# Patient Record
Sex: Female | Born: 2000 | Race: White | Hispanic: No | Marital: Single | Smoking: Never smoker
Health system: Southern US, Community
[De-identification: ages and names within clinical notes are randomized; demographics above are authoritative.]

## PROBLEM LIST (undated history)

## (undated) DIAGNOSIS — F0781 Postconcussional syndrome: Secondary | ICD-10-CM

## (undated) DIAGNOSIS — D649 Anemia, unspecified: Secondary | ICD-10-CM

## (undated) DIAGNOSIS — H5010 Unspecified exotropia: Secondary | ICD-10-CM

## (undated) HISTORY — DX: Anemia, unspecified: D64.9

## (undated) HISTORY — PX: STRABISMUS SURGERY: SHX218

## (undated) HISTORY — PX: WISDOM TOOTH EXTRACTION: SHX21

---
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
## 2020-01-15 ENCOUNTER — Other Ambulatory Visit: Payer: Self-pay

## 2020-01-15 ENCOUNTER — Emergency Department
Admission: EM | Admit: 2020-01-15 | Discharge: 2020-01-15 | Disposition: A | Discharge status: Home / Self Care | Payer: 59 | Attending: Emergency Medicine | Admitting: Emergency Medicine

## 2020-01-15 DIAGNOSIS — T7840XA Allergy, unspecified, initial encounter: Secondary | ICD-10-CM

## 2020-01-15 DIAGNOSIS — T63441A Toxic effect of venom of bees, accidental (unintentional), initial encounter: Secondary | ICD-10-CM | POA: Insufficient documentation

## 2020-01-15 DIAGNOSIS — R070 Pain in throat: Secondary | ICD-10-CM | POA: Diagnosis present

## 2020-01-15 MED ORDER — FAMOTIDINE IN NACL 20-0.9 MG/50ML-% IV SOLN
20.0000 mg | Freq: Once | INTRAVENOUS | Status: AC
  Administered 2020-01-15: 20 mg via INTRAVENOUS
  Filled 2020-01-15: qty 50

## 2020-01-15 MED ORDER — DIPHENHYDRAMINE HCL 50 MG/ML IJ SOLN: 25.0000 mg | Freq: Once | INTRAMUSCULAR | Status: AC

## 2020-01-15 MED ORDER — EPINEPHRINE 0.3 MG/0.3ML IJ SOAJ
INTRAMUSCULAR | Status: AC
  Administered 2020-01-15: 0.3 mg via INTRAMUSCULAR
  Filled 2020-01-15: qty 0.3

## 2020-01-15 MED ORDER — METHYLPREDNISOLONE SODIUM SUCC 125 MG IJ SOLR: 125.0000 mg | Freq: Once | INTRAMUSCULAR | Status: AC

## 2020-01-15 MED ORDER — EPINEPHRINE 0.3 MG/0.3ML IJ SOAJ: 0.3000 mg | INTRAMUSCULAR | 1 refills | Status: DC | PRN

## 2020-01-15 MED ORDER — METHYLPREDNISOLONE SODIUM SUCC 125 MG IJ SOLR
INTRAMUSCULAR | Status: AC
  Administered 2020-01-15: 125 mg via INTRAVENOUS
  Filled 2020-01-15: qty 2

## 2020-01-15 MED ORDER — PREDNISONE 20 MG PO TABS: 40.0000 mg | ORAL_TABLET | Freq: Every day | ORAL | 0 refills | Status: AC

## 2020-01-15 MED ORDER — EPINEPHRINE 0.3 MG/0.3ML IJ SOAJ: 0.3000 mg | Freq: Once | INTRAMUSCULAR | Status: AC

## 2020-01-15 MED ORDER — DIPHENHYDRAMINE HCL 50 MG/ML IJ SOLN
INTRAMUSCULAR | Status: AC
  Administered 2020-01-15: 25 mg via INTRAVENOUS
  Filled 2020-01-15: qty 1

## 2020-01-15 NOTE — Discharge Instructions (Signed)
Please seek medical attention for any high fevers, chest pain, shortness of breath, change in behavior, persistent vomiting, bloody stool or any other new or concerning symptoms.  

## 2020-01-15 NOTE — ED Triage Notes (Signed)
Pt to ed after insect sting approx 30-40 min ago. PT having trouble talking, states in her throat she feels like she needs to cough but can't.  Throat is red, tongue not swollen at this time. No known allergy to bees. Pt took 42mL benadryl

## 2020-01-15 NOTE — ED Provider Notes (Signed)
[facility] Emergency Department Provider Note   ____________________________________________   I have reviewed the triage vital signs and the nursing notes.   HISTORY  Chief Complaint Insect Bite   History limited by: Not Limited   HPI Shari Levine is a 19 y.o. female who presents to the emergency department today because of concerns for possible allergic reaction after a bee sting.  Patient states that she got stung by the bee roughly 30 minutes prior to my evaluation.  It stung her in the right arm.  She did take some Benadryl at home.  She states she is feeling like her throat is tight and her voice has changed.  She denies any nausea or vomiting.  Denies any chest tightness.  Patient denies similar reaction to bee stings in the past.  Records reviewed. Per medical record review patient has a history of allergies to aloe and green tea.   Past Medical History:  Diagnosis Date  . 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 Post concussion syndrome     Patient Active Problem List   Diagnosis Date Noted  . Suicidal ideation     Past Surgical History:  Procedure Laterality Date  . CLOSED REDUCTION NASAL FRACTURE N/A 05/27/2015   Procedure: CLOSED REDUCTION NASAL FRACTURE;  Surgeon: Geanie Logan, MD;  Location: [facility];  Service: ENT;  Laterality: N/A;  . STRABISMUS SURGERY Bilateral    age 56  . STRABISMUS SURGERY Bilateral 08/07/2015   Procedure: REPAIR STRABISMUS PEDIATRIC;  Surgeon: French Ana, MD;  Location: [facility];  Service: Ophthalmology;  Laterality: Bilateral;  . WISDOM TOOTH EXTRACTION      Prior to Admission medications   Medication Sig Start Date End Date Taking? Authorizing Provider  medroxyPROGESTERone (DEPO-PROVERA) 150 MG/ML injection Inject 150 mg into the muscle every 3 (three) months.    [provider]  naproxen (NAPROSYN) 250 MG tablet Take by mouth 2 (two) times daily with a meal.    [provider]    Allergies Aloe and Green tea [cholestatin]  Family History  Problem Relation Age of Onset  . Diabetes Maternal Grandfather   . Hypertension Maternal Grandfather   . Heart disease Maternal Grandfather   . Kidney disease Maternal Grandfather     Social History Social History   Tobacco Use  . Smoking status: Never Smoker  . Smokeless tobacco: Never Used  Vaping Use  . Vaping Use: Never used  Substance Use Topics  . Alcohol use: No  . Drug use: No    Review of Systems Constitutional: No fever/chills Eyes: No visual changes. ENT: Positive for throat tightening.  Cardiovascular: Denies chest pain. Respiratory: Denies shortness of breath. Gastrointestinal: No abdominal pain.  No nausea, no vomiting.  No diarrhea.   Genitourinary: Negative for dysuria. Musculoskeletal: Negative for back pain. Skin: Negative for rash. Neurological: Negative for headaches, focal weakness or numbness.  ____________________________________________   PHYSICAL EXAM:  VITAL SIGNS: ED Triage Vitals  Enc Vitals Group     BP 01/15/20 1948 (!) 154/93     Pulse Rate 01/15/20 1948 98     Resp 01/15/20 1948 20     Temp 01/15/20 1948 99.1 F (37.3 C)     Temp Source 01/15/20 1948 Oral     SpO2 01/15/20 1948 98 %     Weight 01/15/20 1949 160 lb (72.6 kg)     Height 01/15/20 1949 5\' 5"  (1.651 m)   Constitutional: Alert and oriented.  Eyes:  Conjunctivae are normal.  ENT      Head: Normocephalic and atraumatic.      Nose: No congestion/rhinnorhea.      Mouth/Throat: Mucous membranes are moist.      Neck: No stridor. Hematological/Lymphatic/Immunilogical: No cervical lymphadenopathy. Cardiovascular: Normal rate, regular rhythm.  No murmurs, rubs, or gallops.  Respiratory: Normal respiratory effort without tachypnea nor retractions. Breath sounds are clear and equal bilaterally. No wheezes/rales/rhonchi. Gastrointestinal: Soft and non tender. No rebound. No guarding.   Genitourinary: Deferred Musculoskeletal: Normal range of motion in all extremities. No lower extremity edema. Neurologic:  Coarse voice. Moving all extremities. Skin:  Skin is warm, dry and intact. No rash noted. Psychiatric: Mood and affect are normal. Speech and behavior are normal. Patient exhibits appropriate insight and judgment.  ____________________________________________    LABS (pertinent positives/negatives)  None  ____________________________________________   EKG  None  ____________________________________________    RADIOLOGY  None   ____________________________________________   PROCEDURES  Procedures  ____________________________________________   INITIAL IMPRESSION / ASSESSMENT AND PLAN / ED COURSE  Pertinent labs & imaging results that were available during my care of the patient were reviewed by me and considered in my medical decision making (see chart for details).   Patient presented to the emergency department today because of concerns for possible allergic reaction to a bee sting.  Patient states that the bee sting happened about 30 minutes prior to my evaluation.  She did take some Benadryl at home.  On exam patient does have a coarse voice.  She feels like her throat is tight.  Because of this she was given an epinephrine shot.  On recheck shortly thereafter she did feel significant improvement in her throat.  Will plan observing here in the emergency department.  Patient was also given steroids Pepcid.   ____________________________________________   FINAL CLINICAL IMPRESSION(S) / ED DIAGNOSES  Final diagnoses:  Allergic reaction, initial encounter     Note: This dictation was prepared with Dragon dictation. Any transcriptional errors that result from this process are unintentional     Phineas Semen, MD 01/15/20 2027

## 2020-01-16 DIAGNOSIS — T7840XA Allergy, unspecified, initial encounter: Secondary | ICD-10-CM | POA: Diagnosis not present

## 2020-01-16 DIAGNOSIS — R079 Chest pain, unspecified: Secondary | ICD-10-CM | POA: Diagnosis not present

## 2020-01-16 DIAGNOSIS — J029 Acute pharyngitis, unspecified: Secondary | ICD-10-CM | POA: Insufficient documentation

## 2020-01-16 DIAGNOSIS — Z5321 Procedure and treatment not carried out due to patient leaving prior to being seen by health care provider: Secondary | ICD-10-CM | POA: Diagnosis not present

## 2020-01-17 ENCOUNTER — Other Ambulatory Visit: Payer: Self-pay

## 2020-01-17 ENCOUNTER — Encounter: Payer: Self-pay | Admitting: Emergency Medicine

## 2020-01-17 ENCOUNTER — Emergency Department
Admission: EM | Admit: 2020-01-17 | Discharge: 2020-01-17 | Disposition: A | Discharge status: Home / Self Care | Payer: 59 | Attending: Emergency Medicine | Admitting: Emergency Medicine

## 2020-01-17 DIAGNOSIS — J029 Acute pharyngitis, unspecified: Secondary | ICD-10-CM | POA: Diagnosis not present

## 2020-01-17 LAB — BASIC METABOLIC PANEL
Anion gap: 11 (ref 5–15)
BUN: 16 mg/dL (ref 6–20)
CO2: 25 mmol/L (ref 22–32)
Calcium: 8.7 mg/dL — ABNORMAL LOW (ref 8.9–10.3)
Chloride: 106 mmol/L (ref 98–111)
Creatinine, Ser: 0.92 mg/dL (ref 0.44–1.00)
GFR, Estimated: >60 mL/min (ref >60)
Glucose, Bld: 94 mg/dL (ref 70–99)
Potassium: 3.4 mmol/L — ABNORMAL LOW (ref 3.5–5.1)
Sodium: 142 mmol/L (ref 135–145)

## 2020-01-17 LAB — CBC
HCT: 42.6 % (ref 36.0–46.0)
Hemoglobin: 14.7 g/dL (ref 12.0–15.0)
MCH: 30.1 pg (ref 26.0–34.0)
MCHC: 34.5 g/dL (ref 30.0–36.0)
MCV: 87.3 fL (ref 80.0–100.0)
Platelets: 245 10*3/uL (ref 150–400)
RBC: 4.88 MIL/uL (ref 3.87–5.11)
RDW: 12.6 % (ref 11.5–15.5)
WBC: 10.6 10*3/uL — ABNORMAL HIGH (ref 4.0–10.5)
nRBC: 0 % (ref 0.0–0.2)

## 2020-01-17 LAB — MONONUCLEOSIS SCREEN: Mono Screen: NEGATIVE

## 2020-01-17 LAB — GROUP A STREP BY PCR: Group A Strep by PCR: NOT DETECTED

## 2020-01-17 MED ORDER — DIPHENHYDRAMINE HCL 25 MG PO CAPS
50.0000 mg | ORAL_CAPSULE | Freq: Once | ORAL | Status: AC
  Administered 2020-01-17: 50 mg via ORAL
  Filled 2020-01-17: qty 2

## 2020-01-17 MED ORDER — PREDNISONE 20 MG PO TABS
40.0000 mg | ORAL_TABLET | Freq: Once | ORAL | Status: AC
  Administered 2020-01-17: 40 mg via ORAL
  Filled 2020-01-17: qty 2

## 2020-01-17 MED ORDER — FAMOTIDINE 20 MG PO TABS
20.0000 mg | ORAL_TABLET | Freq: Once | ORAL | Status: AC
  Administered 2020-01-17: 20 mg via ORAL
  Filled 2020-01-17: qty 1

## 2020-01-17 NOTE — ED Notes (Signed)
No answer when called several times from lobby 

## 2020-01-17 NOTE — ED Notes (Signed)
Pt sitting in triage on phone calmly, chest rise even and unlabored, skin WNL, in NAD at this time.  Orders placed by  Dr. Manson Passey.

## 2020-01-17 NOTE — ED Triage Notes (Signed)
Pt to ED from home c/o sore throat, feeling like it is swollen.  Pt states stung on Tuesday evening and came to ED and seen and given epipen and other meds, watched for 4 hours and went home and to bed fine.  Woke up the next morning with sore throat that progressively got worse today.  Pt also states developed chest pain upon arrival.  Talking in complete sentences but is hoarse and states some difficulty swallowing saliva.  Took benadryl at home today without relief.

## 2020-07-10 ENCOUNTER — Other Ambulatory Visit: Payer: Self-pay

## 2020-07-10 DIAGNOSIS — Z20822 Contact with and (suspected) exposure to covid-19: Secondary | ICD-10-CM | POA: Diagnosis not present

## 2020-07-10 DIAGNOSIS — A084 Viral intestinal infection, unspecified: Secondary | ICD-10-CM | POA: Diagnosis not present

## 2020-07-10 DIAGNOSIS — R109 Unspecified abdominal pain: Secondary | ICD-10-CM | POA: Diagnosis present

## 2020-07-10 LAB — CBC
HCT: 42.1 % (ref 36.0–46.0)
Hemoglobin: 14.3 g/dL (ref 12.0–15.0)
MCH: 29.3 pg (ref 26.0–34.0)
MCHC: 34 g/dL (ref 30.0–36.0)
MCV: 86.3 fL (ref 80.0–100.0)
Platelets: 218 10*3/uL (ref 150–400)
RBC: 4.88 MIL/uL (ref 3.87–5.11)
RDW: 12.4 % (ref 11.5–15.5)
WBC: 5.8 10*3/uL (ref 4.0–10.5)
nRBC: 0 % (ref 0.0–0.2)

## 2020-07-10 LAB — URINALYSIS, COMPLETE (UACMP) WITH MICROSCOPIC
Bilirubin Urine: NEGATIVE
Glucose, UA: NEGATIVE mg/dL
Hgb urine dipstick: NEGATIVE
Ketones, ur: NEGATIVE mg/dL
Leukocytes,Ua: NEGATIVE
Nitrite: NEGATIVE
Protein, ur: NEGATIVE mg/dL
Specific Gravity, Urine: 1.028 (ref 1.005–1.030)
pH: 5 (ref 5.0–8.0)

## 2020-07-10 LAB — COMPREHENSIVE METABOLIC PANEL
ALT: 36 U/L (ref 0–44)
AST: 31 U/L (ref 15–41)
Albumin: 3.8 g/dL (ref 3.5–5.0)
Alkaline Phosphatase: 76 U/L (ref 38–126)
Anion gap: 9 (ref 5–15)
BUN: 12 mg/dL (ref 6–20)
CO2: 23 mmol/L (ref 22–32)
Calcium: 8.2 mg/dL — ABNORMAL LOW (ref 8.9–10.3)
Chloride: 106 mmol/L (ref 98–111)
Creatinine, Ser: 0.75 mg/dL (ref 0.44–1.00)
GFR, Estimated: >60 mL/min (ref >60)
Glucose, Bld: 113 mg/dL — ABNORMAL HIGH (ref 70–99)
Potassium: 3.3 mmol/L — ABNORMAL LOW (ref 3.5–5.1)
Sodium: 138 mmol/L (ref 135–145)
Total Bilirubin: 0.5 mg/dL (ref 0.3–1.2)
Total Protein: 6.8 g/dL (ref 6.5–8.1)

## 2020-07-10 LAB — LIPASE, BLOOD: Lipase: 31 U/L (ref 11–51)

## 2020-07-10 LAB — POC URINE PREG, ED: Preg Test, Ur: NEGATIVE

## 2020-07-10 NOTE — ED Triage Notes (Signed)
Bilateral lower abd pain since 0300 this AM with n/v. Denies diarrhea. Reports temp of 101.4 and taking tylenol 30 min pta. Reports midsternal chest pain that patient associates with emesis.

## 2020-07-11 ENCOUNTER — Emergency Department
Admission: EM | Admit: 2020-07-11 | Discharge: 2020-07-11 | Disposition: A | Discharge status: Home / Self Care | Payer: Medicaid Other | Attending: Emergency Medicine | Admitting: Emergency Medicine

## 2020-07-11 DIAGNOSIS — A084 Viral intestinal infection, unspecified: Secondary | ICD-10-CM

## 2020-07-11 LAB — RESP PANEL BY RT-PCR (FLU A&B, COVID) ARPGX2
Influenza A by PCR: NEGATIVE
Influenza B by PCR: NEGATIVE
SARS Coronavirus 2 by RT PCR: NEGATIVE

## 2020-07-11 MED ORDER — ONDANSETRON 4 MG PO TBDP: 4.0000 mg | ORAL_TABLET | Freq: Three times a day (TID) | ORAL | 0 refills | Status: DC | PRN

## 2020-07-11 MED ORDER — LACTATED RINGERS IV BOLUS
1000.0000 mL | Freq: Once | INTRAVENOUS | Status: AC
  Administered 2020-07-11: 1000 mL via INTRAVENOUS

## 2020-07-11 MED ORDER — FAMOTIDINE IN NACL 20-0.9 MG/50ML-% IV SOLN
20.0000 mg | Freq: Once | INTRAVENOUS | Status: AC
  Administered 2020-07-11: 20 mg via INTRAVENOUS
  Filled 2020-07-11: qty 50

## 2020-07-11 MED ORDER — KETOROLAC TROMETHAMINE 30 MG/ML IJ SOLN
15.0000 mg | Freq: Once | INTRAMUSCULAR | Status: AC
  Administered 2020-07-11: 15 mg via INTRAVENOUS
  Filled 2020-07-11: qty 1

## 2020-07-11 MED ORDER — ONDANSETRON HCL 4 MG/2ML IJ SOLN
4.0000 mg | Freq: Once | INTRAMUSCULAR | Status: AC
  Administered 2020-07-11: 4 mg via INTRAVENOUS
  Filled 2020-07-11: qty 2

## 2020-07-11 NOTE — ED Provider Notes (Signed)
[facility] Emergency Department Provider Note  ____________________________________________  Time seen: Approximately 12:52 AM  I have reviewed the triage vital signs and the nursing notes.   HISTORY  Chief Complaint Abdominal Pain   HPI Shari Levine is a 20 y.o. female who presents for evaluation of nausea and vomiting.  Patient woke up at 3 AM yesterday morning vomiting.  Has had several episodes of nonbloody nonbilious emesis.  According to her T-max at home of 100.53F.  Has been unable to keep anything down all day today.  No diarrhea.  Has had diffuse crampy abdominal pain.  After vomiting several times patient started having burning sensation in her chest.  At that point she noticed that her blood pressure was elevated which prompted the visit to the emergency room.  No shortness of breath, no cough or congestion.  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   Past Medical History:  Diagnosis Date  . 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 Post concussion syndrome     Patient Active Problem List   Diagnosis Date Noted  . Suicidal ideation     Past Surgical History:  Procedure Laterality Date  . CLOSED REDUCTION NASAL FRACTURE N/A 05/27/2015   Procedure: CLOSED REDUCTION NASAL FRACTURE;  Surgeon: Geanie Logan, MD;  Location: [facility];  Service: ENT;  Laterality: N/A;  . STRABISMUS SURGERY Bilateral    age 52  . STRABISMUS SURGERY Bilateral 08/07/2015   Procedure: REPAIR STRABISMUS PEDIATRIC;  Surgeon: French Ana, MD;  Location: [facility];  Service: Ophthalmology;  Laterality: Bilateral;  . WISDOM TOOTH EXTRACTION      Prior to Admission medications   Medication Sig Start Date End Date Taking? Authorizing Provider  ondansetron (ZOFRAN ODT) 4 MG disintegrating tablet Take 1 tablet (4 mg total) by mouth every 8 (eight) hours as needed. 07/11/20  Yes Hannahgrace Lalli, [place], MD  EPINEPHrine (EPIPEN  2-PAK) 0.3 mg/0.3 mL IJ SOAJ injection Inject 0.3 mg into the muscle as needed for anaphylaxis. 01/15/20   Phineas Semen, MD  medroxyPROGESTERone (DEPO-PROVERA) 150 MG/ML injection Inject 150 mg into the muscle every 3 (three) months.    [provider]  naproxen (NAPROSYN) 250 MG tablet Take by mouth 2 (two) times daily with a meal.    [provider]    Allergies Aloe and Green tea [cholestatin]  Family History  Problem Relation Age of Onset  . Diabetes Maternal Grandfather   . Hypertension Maternal Grandfather   . Heart disease Maternal Grandfather   . Kidney disease Maternal Grandfather     Social History Social History   Tobacco Use  . Smoking status: Never Smoker  . Smokeless tobacco: Never Used  Vaping Use  . Vaping Use: Never used  Substance Use Topics  . Alcohol use: No  . Drug use: No    Review of Systems  Constitutional: + fever. Eyes: Negative for visual changes. ENT: Negative for sore throat. Neck: No neck pain  Cardiovascular: Negative for chest pain. + burning Respiratory: Negative for shortness of breath. Gastrointestinal: + cramping abdominal pain, nausea, and vomiting. No diarrhea. Genitourinary: Negative for dysuria. Musculoskeletal: Negative for back pain. Skin: Negative for rash. Neurological: Negative for headaches, weakness or numbness. Psych: No SI or HI  ____________________________________________   PHYSICAL EXAM:  VITAL SIGNS: ED Triage Vitals  Enc Vitals Group     BP 07/10/20 2313 119/83     Pulse Rate 07/10/20 2313 91  Resp 07/10/20 2313 18     Temp 07/10/20 2313 98.9 F (37.2 C)     Temp Source 07/10/20 2313 Oral     SpO2 07/10/20 2313 98 %     Weight 07/10/20 2308 160 lb (72.6 kg)     Height 07/10/20 2308 5\' 5"  (1.651 m)     Head Circumference --      Peak Flow --      Pain Score 07/10/20 2308 8     Pain Loc --      Pain Edu? --      Excl. in GC? --     Constitutional: Alert and oriented. Well  appearing and in no apparent distress. HEENT:      Head: Normocephalic and atraumatic.         Eyes: Conjunctivae are normal. Sclera is non-icteric.       Mouth/Throat: Mucous membranes are moist.       Neck: Supple with no signs of meningismus. Cardiovascular: Regular rate and rhythm. No murmurs, gallops, or rubs. 2+ symmetrical distal pulses are present in all extremities. No JVD. Respiratory: Normal respiratory effort. Lungs are clear to auscultation bilaterally.  Gastrointestinal: Soft, non tender, and non distended with positive bowel sounds. No rebound or guarding. Genitourinary: No CVA tenderness. Musculoskeletal:  No edema, cyanosis, or erythema of extremities. Neurologic: Normal speech and language. Face is symmetric. Moving all extremities. No gross focal neurologic deficits are appreciated. Skin: Skin is warm, dry and intact. No rash noted. Psychiatric: Mood and affect are normal. Speech and behavior are normal.  ____________________________________________   LABS (all labs ordered are listed, but only abnormal results are displayed)  Labs Reviewed  COMPREHENSIVE METABOLIC PANEL - Abnormal; Notable for the following components:      Result Value   Potassium 3.3 (*)    Glucose, Bld 113 (*)    Calcium 8.2 (*)    All other components within normal limits  URINALYSIS, COMPLETE (UACMP) WITH MICROSCOPIC - Abnormal; Notable for the following components:   Color, Urine YELLOW (*)    APPearance CLEAR (*)    Bacteria, UA RARE (*)    All other components within normal limits  RESP PANEL BY RT-PCR (FLU A&B, COVID) ARPGX2  LIPASE, BLOOD  CBC  POC URINE PREG, ED   ____________________________________________  EKG  ED ECG REPORT I, 09/09/20, the attending physician, personally viewed and interpreted this ECG.  Normal sinus rhythm, rate of 89, normal intervals, normal axis, no ST elevations or  depressions. ____________________________________________  RADIOLOGY  none  ____________________________________________   PROCEDURES  Procedure(s) performed: None Procedures Critical Care performed:  None ____________________________________________   INITIAL IMPRESSION / ASSESSMENT AND PLAN / ED COURSE  20 y.o. female who presents for evaluation of nausea and vomiting, and crampy abdominal pain since 3 AM yesterday morning.  Patient is extremely well-appearing in no distress with normal vital signs, abdomen is soft with no significant tenderness, lungs are clear to auscultation.  Differential diagnosis including viral gastritis versus gastroenteritis versus flu versus Covid.  Labs with no signs of significant dehydration or significant electrolyte derangements, no leukocytosis, no anemia, no ketonuria, no UTI, no pregnancy.  Normal LFTs and lipase.  Patient will be given IV fluids, IV Pepcid, IV Zofran, and IV Toradol.  Will swab for Covid and flu since she is a 26.  History gathered from patient and her mom is at bedside.  Plan discussed with both of them.  Old medical records reviewed  _________________________ 1:45 AM  on 07/11/2020 -----------------------------------------  Covid and flu negative.  Patient feels markedly improved.  Will discharge home on a bland diet, Zofran, increase oral hydration, and close follow-up with PCP.  Discussed my standard return precautions    _____________________________________________ Please note:  Patient was evaluated in Emergency Department today for the symptoms described in the history of present illness. Patient was evaluated in the context of the global COVID-19 pandemic, which necessitated consideration that the patient might be at risk for infection with the SARS-CoV-2 virus that causes COVID-19. Institutional protocols and algorithms that pertain to the evaluation of patients at risk for COVID-19 are in a state of  rapid change based on information released by regulatory bodies including the CDC and federal and state organizations. These policies and algorithms were followed during the patient's care in the ED.  Some ED evaluations and interventions may be delayed as a result of limited staffing during the pandemic.   [place] Controlled Substance Database was reviewed by me. ____________________________________________   FINAL CLINICAL IMPRESSION(S) / ED DIAGNOSES   Final diagnoses:  Viral gastroenteritis      NEW MEDICATIONS STARTED DURING THIS VISIT:  ED Discharge Orders         Ordered    ondansetron (ZOFRAN ODT) 4 MG disintegrating tablet  Every 8 hours PRN        07/11/20 0144           Note:  This document was prepared using Dragon voice recognition software and may include unintentional dictation errors.    Don Perking, [place], MD 07/11/20 [phone]

## 2020-10-13 ENCOUNTER — Encounter: Payer: Self-pay | Admitting: Emergency Medicine

## 2020-10-13 ENCOUNTER — Ambulatory Visit
Admission: EM | Admit: 2020-10-13 | Discharge: 2020-10-13 | Disposition: A | Discharge status: Home / Self Care | Payer: Medicaid Other | Attending: Emergency Medicine | Admitting: Emergency Medicine

## 2020-10-13 ENCOUNTER — Other Ambulatory Visit: Payer: Self-pay

## 2020-10-13 DIAGNOSIS — B349 Viral infection, unspecified: Secondary | ICD-10-CM

## 2020-10-13 LAB — POCT RAPID STREP A (OFFICE): Rapid Strep A Screen: NEGATIVE

## 2020-10-13 MED ORDER — LIDOCAINE VISCOUS HCL 2 % MT SOLN: 15.0000 mL | OROMUCOSAL | 0 refills | Status: AC | PRN

## 2020-10-13 NOTE — ED Triage Notes (Signed)
Patient c/o sore throat x 3 days.   Patient denies fever at home.   Patient endorses nasal congestion.   Patient endorses RT sided "neck lymph node and ear swelling".   Patient has taken Tylenol with no relief of symptoms.

## 2020-10-13 NOTE — Discharge Instructions (Addendum)
We will call you with any positive results from your COVID-19 testing completed in clinic today.  If you do not receive a phone call from us within the next 3-5 days, check your MyChart for up-to-date health information related to testing completed in clinic today.   For most people this is a self-limiting process and can take anywhere from 7 - 10 days to start feeling better. A cough can last up to 3 weeks. Pay special attention to handwashing as this can help prevent the spread of the virus.   Always read the labels of cough and cold medications as they may contain some of the ingredients below.  Rest, push lots of fluids (especially water), and utilize supportive care for symptoms. You may take acetaminophen (Tylenol) every 4-6 hours and ibuprofen every 6-8 hours for muscle pain, joint pain, headaches (you may also alternate these medications). Mucinex (guaifenesin) may be taken over the counter for cough as needed can loosen phlegm. Please read the instructions and take as directed.  Sudafed (pseudophedrine) is sold behind the counter and can help reduce nasal pressure; avoid taking this if you have high blood pressure or feel jittery. Sudafed PE (phenylephrine) can be a helpful, short-term, over-the-counter alternative to limit side effects or if you have high blood pressure.  Flonase nasal spray can help alleviate congestion and sinus pressure. Many patients choose Afrin as a nasal decongestant; do not use for more than 3 days for risk of rebound (increased symptoms after stopping medication).  Saline nasal sprays or rinses can also help nasal congestion (use bottled or sterile water). Warm tea with lemon and honey can sooth sore throat and cough, as can cough drops.   Return to clinic for high fever not improving with medications, chest pain, difficulty breathing, non-stop vomiting, or coughing blood. Follow-up with your primary care provider if symptoms do not improve as expected in the next  5-7 days.  

## 2020-10-13 NOTE — ED Provider Notes (Addendum)
CHIEF COMPLAINT:   Chief Complaint  Patient presents with   Sore Throat     SUBJECTIVE/HPI:   Sore Throat  A very pleasant 20 y.o.Female presents today with sore throat for the last 3 days. She also reports R ear pain. Has tried tylenol without relief. Patient does not report any shortness of breath, chest pain, palpitations, visual changes, weakness, tingling, headache, nausea, vomiting, diarrhea, fever, chills   has a past medical history of Exotropia of both eyes (07/2015) and Post concussion syndrome.  ROS:  Review of Systems See Subjective/HPI Medications, Allergies and Problem List personally reviewed in Epic today OBJECTIVE:   Vitals:   10/13/20 0916  BP: 124/81  Pulse: 77  Resp: 18  Temp: 98.5 F (36.9 C)  SpO2: 96%    Physical Exam   General: Appears well-developed and well-nourished. No acute distress.  HEENT Head: Normocephalic and atraumatic.   Ears: Hearing grossly intact, no drainage or visible deformity.  Nose: No nasal deviation.   Mouth/Throat: No stridor or tracheal deviation.  Mildly erythematous posterior pharynx noted with clear drainage present.  No white patchy exudate noted. Eyes: Conjunctivae and EOM are normal. No eye drainage or scleral icterus bilaterally.  Neck: Normal range of motion, neck is supple.  Cardiovascular: Normal rate. Regular rhythm; no murmurs, gallops, or rubs.  Pulm/Chest: No respiratory distress. Breath sounds normal bilaterally without wheezes, rhonchi, or rales.  Neurological: Alert and oriented to person, place, and time.  Skin: Skin is warm and dry.  No rashes, lesions, abrasions or bruising noted to skin.   Psychiatric: Normal mood, affect, behavior, and thought content.   Vital signs and nursing note reviewed.   Patient stable and cooperative with examination. PROCEDURES:    LABS/X-RAYS/EKG/MEDS:    Results for orders placed or performed during the hospital encounter of 10/13/20  POCT rapid strep A  Result  Value Ref Range   Rapid Strep A Screen Negative Negative   Strep negative COVID pending MEDICAL DECISION MAKING:   Patient presents with sore throat for the last 3 days. She also reports R ear pain. Has tried tylenol without relief. Patient does not report any shortness of breath, chest pain, palpitations, visual changes, weakness, tingling, headache, nausea, vomiting, diarrhea, fever, chills. Chart review completed. Strep negative. COVID PCR pending. Likely, viral illness. Low suspicion for underlying bacterial etiology which would require antibiotics. Rx'd viscous lidocaine to the patient's pharmacy. Advised of at home treatment and care as outlined in AVS. Provided work note. Return as needed.  Patient verbalized understanding and agreed with treatment plan.  Patient stable upon discharge. ASSESSMENT/PLAN:  1. Viral illness - Novel Coronavirus, NAA ([facility]); Standing - Novel Coronavirus, NAA ([facility])  Meds ordered this encounter  Medications   lidocaine (XYLOCAINE) 2 % solution    Sig: Use as directed 15 mLs in the mouth or throat as needed for up to 7 days for mouth pain.    Dispense:  100 mL    Refill:  0    Order Specific Question:   Supervising Provider    Answer:   Merrilee Jansky [id number]    Instructions about new medications and side effects provided.  Plan:   Discharge Instructions      We will call you with any positive results from your COVID-19 testing completed in clinic today.  If you do not receive a phone call from [place] within the next 3-5 days, check your MyChart for up-to-date health information related to testing completed in clinic today.  For most people this is a self-limiting process and can take anywhere from 7 - 10 days to start feeling better. A cough can last up to 3 weeks. Pay special attention to handwashing as this can help prevent the spread of the virus.   Always read the labels of cough and cold medications as they may contain some of the  ingredients below.  Rest, push lots of fluids (especially water), and utilize supportive care for symptoms. You may take acetaminophen (Tylenol) every 4-6 hours and ibuprofen every 6-8 hours for muscle pain, joint pain, headaches (you may also alternate these medications). Mucinex (guaifenesin) may be taken over the counter for cough as needed can loosen phlegm. Please read the instructions and take as directed.  Sudafed (pseudophedrine) is sold behind the counter and can help reduce nasal pressure; avoid taking this if you have high blood pressure or feel jittery. Sudafed PE (phenylephrine) can be a helpful, short-term, over-the-counter alternative to limit side effects or if you have high blood pressure.  Flonase nasal spray can help alleviate congestion and sinus pressure. Many patients choose Afrin as a nasal decongestant; do not use for more than 3 days for risk of rebound (increased symptoms after stopping medication).  Saline nasal sprays or rinses can also help nasal congestion (use bottled or sterile water). Warm tea with lemon and honey can sooth sore throat and cough, as can cough drops.   Return to clinic for high fever not improving with medications, chest pain, difficulty breathing, non-stop vomiting, or coughing blood. Follow-up with your primary care provider if symptoms do not improve as expected in the next 5-7 days.        A copy of these instructions have been given to the patient or responsible adult who demonstrated the ability to learn, asked appropriate questions, and verbalized understanding of the plan of care.  There were no barriers to learning identified.    Amalia Greenhouse, FNP-C 10/13/20   This note was partially made with the aid of speech-to-text dictation; typographical errors are not intentional.    Amalia Greenhouse, FNP 10/13/20 1019    Amalia Greenhouse, FNP 10/13/20 1026  Clinical Course as of 10/13/20 1028  Mon Oct 13, 2020  0930 POCT rapid strep A  [KB]    Clinical Course User Index [KB] Amalia Greenhouse, FNP       Amalia Greenhouse, FNP 10/13/20 1028

## 2020-10-14 LAB — NOVEL CORONAVIRUS, NAA: SARS-CoV-2, NAA: NOT DETECTED

## 2020-10-14 LAB — SARS-COV-2, NAA 2 DAY TAT

## 2021-09-09 ENCOUNTER — Emergency Department (HOSPITAL_COMMUNITY)
Admission: EM | Admit: 2021-09-09 | Discharge: 2021-09-09 | Disposition: A | Discharge status: Home / Self Care | Payer: No Typology Code available for payment source | Attending: Emergency Medicine | Admitting: Emergency Medicine

## 2021-09-09 ENCOUNTER — Emergency Department (HOSPITAL_COMMUNITY): Payer: No Typology Code available for payment source

## 2021-09-09 ENCOUNTER — Encounter (HOSPITAL_COMMUNITY): Payer: Self-pay | Admitting: Emergency Medicine

## 2021-09-09 DIAGNOSIS — S6992XA Unspecified injury of left wrist, hand and finger(s), initial encounter: Secondary | ICD-10-CM | POA: Diagnosis present

## 2021-09-09 DIAGNOSIS — X500XXA Overexertion from strenuous movement or load, initial encounter: Secondary | ICD-10-CM | POA: Insufficient documentation

## 2021-09-09 DIAGNOSIS — Y990 Civilian activity done for income or pay: Secondary | ICD-10-CM | POA: Insufficient documentation

## 2021-09-09 NOTE — Discharge Instructions (Signed)
As we discussed your left middle finger is broken.  I placed you in a splint to ensure that it is immobilized.  Recommend that you wear the splint at all times.  I have also given you a referral to our hand specialist to call and schedule an appointment for follow-up of your injuries.  You will need to do this in 1 week.  In the interim, please ensure that you rest, ice, compress, and elevate your injuries as well as take Tylenol/ibuprofen as needed for pain.  Return if development of any new or worsening symptoms.

## 2021-09-09 NOTE — ED Provider Notes (Signed)
[facility] DEPT Provider Note   CSN: [id number] Arrival date & time: 09/09/21  1436     History  Chief Complaint  Patient presents with   Finger Injury    Shari Levine is a 21 y.o. female.  Patient with no pertinent past medical history presents today with left middle finger injury. She states that earlier this morning when she was working at [employer] she was lifting a heavy box of soda above her head when the box slipped and fell out of her hand and landed on her proximal left middle finger. She endorses persistent pain since then. Took tylenol without relief. Denies any other injuries or complaints  The history is provided by the patient. No language interpreter was used.      Home Medications Prior to Admission medications   Medication Sig Start Date End Date Taking? Authorizing Provider  EPINEPHrine (EPIPEN 2-PAK) 0.3 mg/0.3 mL IJ SOAJ injection Inject 0.3 mg into the muscle as needed for anaphylaxis. 01/15/20   Nance Pear, MD  medroxyPROGESTERone (DEPO-PROVERA) 150 MG/ML injection Inject 150 mg into the muscle every 3 (three) months.    [provider]  naproxen (NAPROSYN) 250 MG tablet Take by mouth 2 (two) times daily with a meal.    [provider]  ondansetron (ZOFRAN ODT) 4 MG disintegrating tablet Take 1 tablet (4 mg total) by mouth every 8 (eight) hours as needed. 07/11/20   Rudene Re, MD      Allergies    Aloe and Green tea [cholestatin]    Review of Systems   Review of Systems  Musculoskeletal:  Positive for arthralgias.  All other systems reviewed and are negative.  Physical Exam Updated Vital Signs BP 139/88 (BP Location: Left Arm)   Pulse 84   Temp 98 F (36.7 C) (Oral)   Resp 18   SpO2 97%  Physical Exam Vitals and nursing note reviewed.  Constitutional:      General: She is not in acute distress.    Appearance: Normal appearance. She is normal weight. She is not ill-appearing,  toxic-appearing or diaphoretic.  HENT:     Head: Normocephalic and atraumatic.  Cardiovascular:     Rate and Rhythm: Normal rate.  Pulmonary:     Effort: Pulmonary effort is normal. No respiratory distress.  Musculoskeletal:        General: Normal range of motion.     Cervical back: Normal range of motion.     Comments: Tenderness noted to the MCP joint of the left middle finger with some bruising without erythema or warmth. Some swelling noted in this area as well. ROM intact with some pain. Distal sensation intact, capillary refill less than 2 seconds.  Skin:    General: Skin is warm and dry.  Neurological:     General: No focal deficit present.     Mental Status: She is alert.  Psychiatric:        Mood and Affect: Mood normal.        Behavior: Behavior normal.    ED Results / Procedures / Treatments   Labs (all labs ordered are listed, but only abnormal results are displayed) Labs Reviewed - No data to display  EKG None  Radiology DG Finger Middle Left  Result Date: 09/09/2021 CLINICAL DATA:  Pain in proximal phalange, after box fell on finger EXAM: LEFT MIDDLE FINGER 2+V COMPARISON:  None Available. FINDINGS: Acute minimally displaced intra-articular fracture of the base of the first digit proximal phalanx. No  dislocation. There is no evidence of arthropathy or other focal bone abnormality. Associated subcutaneus soft tissue edema. IMPRESSION: Acute minimally displaced intra-articular fracture of the base of the first digit proximal phalanx. No dislocation. Electronically Signed   By: Iven Finn M.D.   On: 09/09/2021 16:21    Procedures Procedures    Medications Ordered in ED Medications - No data to display  ED Course/ Medical Decision Making/ A&P                           Medical Decision Making  Patient presents today with left middle finger injury. She is afebrile, nontoxic-appearing, and in no acute distress with reassuring vital signs.  Patient left middle  finger X-Ray reveals   Acute minimally displaced intra-articular fracture of the base of the first digit proximal phalanx. No dislocation.     I have personally reviewed and interpreted this imaging and agree with radiology interpretation.   Pain medication offered in the ED which patient refused.  Patient placed in finger splint for management of fracture and given hand surgery follow-up for further evaluation.  Will recommend RICE and Tylenol/ibuprofen as needed for at home. Patient will be dc home & is agreeable with above plan.  Educated on red flag symptoms of prompt immediate return.  Discharged in stable condition.  Findings and plan of care discussed with supervising physician Dr. Armandina Gemma who is in agreement.   Final Clinical Impression(s) / ED Diagnoses Final diagnoses:  Injury of finger of left hand, initial encounter    Rx / DC Orders ED Discharge Orders     None     An After Visit Summary was printed and given to the patient.     Nestor Lewandowsky 09/09/21 1728    Regan Lemming, MD 09/09/21 [phone]

## 2021-09-09 NOTE — ED Provider Triage Note (Signed)
Emergency Medicine Provider Triage Evaluation Note  Burnett Kanaris , a 21 y.o. female  was evaluated in triage.  Pt complains of left middle finger pain secondary to an injury at work.  Patient states she was lifting a heavy box when the box slipped, falling and landing on her proximal middle left finger.  Patient is able to move the digit but has swelling, contusion, and pain.  Review of Systems  Positive: Left middle finger pain Negative: Loss of consciousness  Physical Exam  BP 139/88 (BP Location: Left Arm)   Pulse 84   Temp 98 F (36.7 C) (Oral)   Resp 18   SpO2 97%  Gen:   Awake, no distress   Resp:  Normal effort  MSK:   Moves extremities without difficulty  Other:    Medical Decision Making  Medically screening exam initiated at 3:46 PM.  Appropriate orders placed.  Kenyatta D Hires was informed that the remainder of the evaluation will be completed by another provider, this initial triage assessment does not replace that evaluation, and the importance of remaining in the ED until their evaluation is complete.     Darrick Grinder, PA-C 09/09/21 1547

## 2021-09-09 NOTE — ED Triage Notes (Signed)
Pt reports injury to left middle finger at work today. Pt believes finger is dislocated. Pain radiates into arm.

## 2022-06-09 ENCOUNTER — Encounter: Payer: Self-pay | Admitting: Oncology

## 2022-06-09 ENCOUNTER — Inpatient Hospital Stay: Payer: Medicaid Other | Attending: Oncology | Admitting: Oncology

## 2022-06-09 ENCOUNTER — Inpatient Hospital Stay: Payer: Medicaid Other

## 2022-06-09 VITALS — BP 112/63 | HR 69 | Temp 97.0°F | Resp 18 | Wt 166.7 lb

## 2022-06-09 DIAGNOSIS — Z793 Long term (current) use of hormonal contraceptives: Secondary | ICD-10-CM | POA: Diagnosis not present

## 2022-06-09 DIAGNOSIS — D509 Iron deficiency anemia, unspecified: Secondary | ICD-10-CM | POA: Diagnosis not present

## 2022-06-09 DIAGNOSIS — Z79899 Other long term (current) drug therapy: Secondary | ICD-10-CM | POA: Diagnosis not present

## 2022-06-09 DIAGNOSIS — D500 Iron deficiency anemia secondary to blood loss (chronic): Secondary | ICD-10-CM

## 2022-06-09 DIAGNOSIS — N920 Excessive and frequent menstruation with regular cycle: Secondary | ICD-10-CM | POA: Insufficient documentation

## 2022-06-09 LAB — TECHNOLOGIST SMEAR REVIEW: Plt Morphology: NORMAL

## 2022-06-09 LAB — CBC WITH DIFFERENTIAL/PLATELET
Abs Immature Granulocytes: 0.01 10*3/uL (ref 0.00–0.07)
Basophils Absolute: 0 10*3/uL (ref 0.0–0.1)
Basophils Relative: 1 %
Eosinophils Absolute: 0.1 10*3/uL (ref 0.0–0.5)
Eosinophils Relative: 2 %
HCT: 35.8 % — ABNORMAL LOW (ref 36.0–46.0)
Hemoglobin: 11 g/dL — ABNORMAL LOW (ref 12.0–15.0)
Immature Granulocytes: 0 %
Lymphocytes Relative: 24 %
Lymphs Abs: 1.4 10*3/uL (ref 0.7–4.0)
MCH: 24.6 pg — ABNORMAL LOW (ref 26.0–34.0)
MCHC: 30.7 g/dL (ref 30.0–36.0)
MCV: 79.9 fL — ABNORMAL LOW (ref 80.0–100.0)
Monocytes Absolute: 0.5 10*3/uL (ref 0.1–1.0)
Monocytes Relative: 8 %
Neutro Abs: 3.8 10*3/uL (ref 1.7–7.7)
Neutrophils Relative %: 65 %
Platelets: 213 10*3/uL (ref 150–400)
RBC: 4.48 MIL/uL (ref 3.87–5.11)
RDW: 16 % — ABNORMAL HIGH (ref 11.5–15.5)
WBC: 5.9 10*3/uL (ref 4.0–10.5)
nRBC: 0 % (ref 0.0–0.2)

## 2022-06-09 LAB — IRON AND TIBC
Iron: 39 ug/dL (ref 28–170)
Saturation Ratios: 8 % — ABNORMAL LOW (ref 10.4–31.8)
TIBC: 479 ug/dL — ABNORMAL HIGH (ref 250–450)
UIBC: 440 ug/dL

## 2022-06-09 LAB — COMPREHENSIVE METABOLIC PANEL
ALT: 15 U/L (ref 0–44)
AST: 15 U/L (ref 15–41)
Albumin: 4.1 g/dL (ref 3.5–5.0)
Alkaline Phosphatase: 54 U/L (ref 38–126)
Anion gap: 8 (ref 5–15)
BUN: 15 mg/dL (ref 6–20)
CO2: 24 mmol/L (ref 22–32)
Calcium: 8.4 mg/dL — ABNORMAL LOW (ref 8.9–10.3)
Chloride: 105 mmol/L (ref 98–111)
Creatinine, Ser: 0.82 mg/dL (ref 0.44–1.00)
GFR, Estimated: >60 mL/min (ref >60)
Glucose, Bld: 91 mg/dL (ref 70–99)
Potassium: 4.2 mmol/L (ref 3.5–5.1)
Sodium: 137 mmol/L (ref 135–145)
Total Bilirubin: 0.4 mg/dL (ref 0.3–1.2)
Total Protein: 7.2 g/dL (ref 6.5–8.1)

## 2022-06-09 LAB — RETIC PANEL
Immature Retic Fract: 12.3 % (ref 2.3–15.9)
RBC.: 4.42 MIL/uL (ref 3.87–5.11)
Retic Count, Absolute: 44.6 10*3/uL (ref 19.0–186.0)
Retic Ct Pct: 1 % (ref 0.4–3.1)
Reticulocyte Hemoglobin: 26.6 pg — ABNORMAL LOW (ref >27.9)

## 2022-06-09 LAB — FERRITIN: Ferritin: 6 ng/mL — ABNORMAL LOW (ref 11–307)

## 2022-06-09 NOTE — Progress Notes (Signed)
Hematology/Oncology Consult note [phone] [phone]        REFERRING PROVIDER: Herbert Deaner, FNP   CHIEF COMPLAINTS/REASON FOR VISIT:  Evaluation of anemia   ASSESSMENT & PLAN:   IDA (iron deficiency anemia) Microcytic anemia, suspect iron deficiency. Check CBC, iron TIBC ferritin Discussed about options of iron supplementation if iron deficiency anemia is confirmed. I discussed about option of continue oral iron supplementation and repeat blood work for evaluation of treatment response.  If no significant improvement, then proceed with IV Venofer treatments. Alternative option of proceed with IV Venofer treatments. I discussed about the potential risks including but not limited to allergic reactions/infusion reactions including anaphylactic reactions, phlebitis, high blood pressure, wheezing, SOB, skin rash, weight gain,dark urine, leg swelling, back pain, headache, nausea and fatigue, etc.  Plan IV venofer weekly x3  Labs are reviewed, hemoglobin 11, ferritin 6, consistent with IDA. Will reach out to patient to see if she would like to proceed with oral iron supplementation vs IV venofer.    Orders Placed This Encounter  Procedures   Ferritin    Standing Status:   Future    Number of Occurrences:   1    Standing Expiration Date:   12/10/2022   Iron and TIBC    Standing Status:   Future    Number of Occurrences:   1    Standing Expiration Date:   06/09/2023   CBC with Differential/Platelet    Standing Status:   Future    Number of Occurrences:   1    Standing Expiration Date:   06/09/2023   Retic Panel    Standing Status:   Future    Number of Occurrences:   1    Standing Expiration Date:   06/09/2023   Comprehensive metabolic panel    Standing Status:   Future    Number of Occurrences:   1    Standing Expiration Date:   06/09/2023   Technologist smear review    Standing Status:   Future    Number of Occurrences:   1    Standing Expiration  Date:   06/09/2023    Order Specific Question:   Clinical information:    Answer:   anemia   Follow up in 3 months.  All questions were answered. The patient knows to call the clinic with any problems, questions or concerns.  Earlie Server, MD, PhD [facility] Hematology Oncology 06/09/2022   HISTORY OF PRESENTING ILLNESS:   Shari Levine is a  22 y.o.  female with PMH listed below was seen in consultation at the request of  Herbert Deaner, FNP  for evaluation of anemia Patient recently had blood work done at primary care provider's office.  CBC showed a hemoglobin of 10.5, MCV 76. Patient reports heavy menstrual period.  She feels tired.  Denies any chest pain, palpitation, shortness of breath.  Patient has heavy menstrual period She has started on oral iron supplementation for 1 months.  Overall tolerates well.  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   Post concussion syndrome     SURGICAL HISTORY: Past Surgical History:  Procedure Laterality Date   CLOSED REDUCTION NASAL FRACTURE N/A 05/27/2015   Procedure: CLOSED REDUCTION NASAL FRACTURE;  Surgeon: Clyde Canterbury, MD;  Location: [place];  Service: ENT;  Laterality: N/A;   STRABISMUS SURGERY Bilateral    age 21   STRABISMUS SURGERY Bilateral 08/07/2015  Procedure: REPAIR STRABISMUS PEDIATRIC;  Surgeon: Lamonte Sakai, MD;  Location: [place];  Service: Ophthalmology;  Laterality: Bilateral;   WISDOM TOOTH EXTRACTION      SOCIAL HISTORY: Social History   Socioeconomic History   Marital status: Single    Spouse name: Not on file   Number of children: Not on file   Years of education: Not on file   Highest education level: Not on file  Occupational History   Not on file  Tobacco Use   Smoking status: Never   Smokeless tobacco: Never  Vaping Use   Vaping Use: Never used  Substance and Sexual Activity   Alcohol use: No   Drug use: No   Sexual  activity: Not on file  Other Topics Concern   Not on file  Social History Narrative   Right handed    Lives with family    Social Determinants of Health   Financial Resource Strain: Not on file  Food Insecurity: No Food Insecurity (06/09/2022)   Hunger Vital Sign    Worried About Running Out of Food in the Last Year: Never true    Ran Out of Food in the Last Year: Never true  Transportation Needs: No Transportation Needs (06/09/2022)   PRAPARE - Hydrologist (Medical): No    Lack of Transportation (Non-Medical): No  Physical Activity: Not on file  Stress: Not on file  Social Connections: Not on file  Intimate Partner Violence: Not At Risk (06/09/2022)   Humiliation, Afraid, Rape, and Kick questionnaire    Fear of Current or Ex-Partner: No    Emotionally Abused: No    Physically Abused: No    Sexually Abused: No    FAMILY HISTORY: Family History  Problem Relation Age of Onset   Hypertension Mother    Diabetes Maternal Grandfather    Hypertension Maternal Grandfather    Heart disease Maternal Grandfather    Kidney disease Maternal Grandfather    Skin cancer Maternal Grandfather    Cancer Paternal Grandmother    Breast cancer Paternal Great-grandmother     ALLERGIES:  is allergic to beef-derived products, aloe, and green tea [cholestatin].  MEDICATIONS:  Current Outpatient Medications  Medication Sig Dispense Refill   EPINEPHrine (EPIPEN 2-PAK) 0.3 mg/0.3 mL IJ SOAJ injection Inject 0.3 mg into the muscle as needed for anaphylaxis. 1 each 1   ferrous sulfate 325 (65 FE) MG EC tablet Take 325 mg by mouth daily with breakfast.     medroxyPROGESTERone (DEPO-PROVERA) 150 MG/ML injection Inject 150 mg into the muscle every 3 (three) months.     No current facility-administered medications for this visit.    Review of Systems  Constitutional:  Positive for fatigue. Negative for appetite change, chills and fever.  HENT:   Negative for hearing loss  and voice change.   Eyes:  Negative for eye problems.  Respiratory:  Negative for chest tightness and cough.   Cardiovascular:  Negative for chest pain.  Gastrointestinal:  Negative for abdominal distention, abdominal pain and blood in stool.  Endocrine: Negative for hot flashes.  Genitourinary:  Positive for menstrual problem. Negative for difficulty urinating and frequency.   Musculoskeletal:  Negative for arthralgias.  Skin:  Negative for itching and rash.  Neurological:  Negative for extremity weakness.  Hematological:  Negative for adenopathy.  Psychiatric/Behavioral:  Negative for confusion.    PHYSICAL EXAMINATION: Vitals:   06/09/22 1047  BP: 112/63  Pulse: 69  Resp: 18  Temp: (!)  97 F (36.1 C)   Filed Weights   06/09/22 1047  Weight: 166 lb 11.2 oz (75.6 kg)    Physical Exam  LABORATORY DATA:  I have reviewed the data as listed    Latest Ref Rng & Units 06/09/2022   11:29 AM 07/10/2020   11:14 PM 01/17/2020    1:10 AM  CBC  WBC 4.0 - 10.5 K/uL 5.9  5.8  10.6   Hemoglobin 12.0 - 15.0 g/dL 11.0  14.3  14.7   Hematocrit 36.0 - 46.0 % 35.8  42.1  42.6   Platelets 150 - 400 K/uL 213  218  245       Latest Ref Rng & Units 06/09/2022   11:29 AM 07/10/2020   11:14 PM 01/17/2020    1:10 AM  CMP  Glucose 70 - 99 mg/dL 91  113  94   BUN 6 - 20 mg/dL '15  12  16   '$ Creatinine 0.44 - 1.00 mg/dL 0.82  0.75  0.92   Sodium 135 - 145 mmol/L 137  138  142   Potassium 3.5 - 5.1 mmol/L 4.2  3.3  3.4   Chloride 98 - 111 mmol/L 105  106  106   CO2 22 - 32 mmol/L '24  23  25   '$ Calcium 8.9 - 10.3 mg/dL 8.4  8.2  8.7   Total Protein 6.5 - 8.1 g/dL 7.2  6.8    Total Bilirubin 0.3 - 1.2 mg/dL 0.4  0.5    Alkaline Phos 38 - 126 U/L 54  76    AST 15 - 41 U/L 15  31    ALT 0 - 44 U/L 15  36        RADIOGRAPHIC STUDIES: I have personally reviewed the radiological images as listed and agreed with the findings in the report. No results found.

## 2022-06-09 NOTE — Assessment & Plan Note (Addendum)
Microcytic anemia, suspect iron deficiency. Check CBC, iron TIBC ferritin Discussed about options of iron supplementation if iron deficiency anemia is confirmed. I discussed about option of continue oral iron supplementation and repeat blood work for evaluation of treatment response.  If no significant improvement, then proceed with IV Venofer treatments. Alternative option of proceed with IV Venofer treatments. I discussed about the potential risks including but not limited to allergic reactions/infusion reactions including anaphylactic reactions, phlebitis, high blood pressure, wheezing, SOB, skin rash, weight gain,dark urine, leg swelling, back pain, headache, nausea and fatigue, etc.  Plan IV venofer weekly x3  Labs are reviewed, hemoglobin 11, ferritin 6, consistent with IDA. Will reach out to patient to see if she would like to proceed with oral iron supplementation vs IV venofer.

## 2022-06-10 ENCOUNTER — Telehealth: Payer: Self-pay

## 2022-06-10 DIAGNOSIS — D500 Iron deficiency anemia secondary to blood loss (chronic): Secondary | ICD-10-CM

## 2022-06-10 DIAGNOSIS — D509 Iron deficiency anemia, unspecified: Secondary | ICD-10-CM

## 2022-06-10 NOTE — Telephone Encounter (Signed)
-----   Message from Earlie Server, MD sent at 06/09/2022  7:06 PM EST ----- Please let patient know that her blood work shows iron deficiency anemia.  Hemoglobin has slightly improved since she started on oral iron supplementation. I recommend her to either continue oral supplementation, if in 3 months she is still anemic, will proceed with IV iron at that time;  or to proceed with IV Venofer weekly x 3 now.  If she elects to IV Venofer treatments, please arrange weekly lab-urine pregnancy test+ Venofer x 3 Follow up in 3 months, labs prior to MD +/- Venofer  Thank you

## 2022-06-10 NOTE — Telephone Encounter (Signed)
Called and spoke to pt, she said that after taking oral iron for a month she started to feel sick so she would like to proceed with IV iron. Informed her that she will need urine pregnancy test prior to each infusion and she verbalized understanding.   Please contact pt with appts via ph or mychart:   Lab (urine preg ) / IV venofer weekly x3 (first dose is NEW)  Labs in 3 months, then MD/ poss venofer 1 - 2 days after labs.

## 2022-06-16 ENCOUNTER — Inpatient Hospital Stay: Payer: Medicaid Other

## 2022-06-16 VITALS — BP 120/64 | HR 81 | Temp 96.0°F | Resp 16

## 2022-06-16 DIAGNOSIS — D500 Iron deficiency anemia secondary to blood loss (chronic): Secondary | ICD-10-CM

## 2022-06-16 DIAGNOSIS — D509 Iron deficiency anemia, unspecified: Secondary | ICD-10-CM | POA: Diagnosis not present

## 2022-06-16 LAB — PREGNANCY, URINE: Preg Test, Ur: NEGATIVE

## 2022-06-16 MED ORDER — SODIUM CHLORIDE 0.9 % IV SOLN
Freq: Once | INTRAVENOUS | Status: AC
  Filled 2022-06-16: qty 250

## 2022-06-16 MED ORDER — SODIUM CHLORIDE 0.9 % IV SOLN
200.0000 mg | Freq: Once | INTRAVENOUS | Status: AC
  Administered 2022-06-16: 200 mg via INTRAVENOUS
  Filled 2022-06-16: qty 10

## 2022-06-16 NOTE — Patient Instructions (Signed)

## 2022-06-23 ENCOUNTER — Inpatient Hospital Stay: Payer: Medicaid Other

## 2022-06-23 VITALS — BP 137/84 | HR 88 | Temp 96.3°F | Resp 16

## 2022-06-23 DIAGNOSIS — D500 Iron deficiency anemia secondary to blood loss (chronic): Secondary | ICD-10-CM

## 2022-06-23 DIAGNOSIS — D509 Iron deficiency anemia, unspecified: Secondary | ICD-10-CM | POA: Diagnosis not present

## 2022-06-23 LAB — PREGNANCY, URINE: Preg Test, Ur: NEGATIVE

## 2022-06-23 MED ORDER — SODIUM CHLORIDE 0.9 % IV SOLN
Freq: Once | INTRAVENOUS | Status: AC
  Filled 2022-06-23: qty 250

## 2022-06-23 MED ORDER — SODIUM CHLORIDE 0.9 % IV SOLN
200.0000 mg | Freq: Once | INTRAVENOUS | Status: AC
  Administered 2022-06-23: 200 mg via INTRAVENOUS
  Filled 2022-06-23: qty 200

## 2022-06-23 NOTE — Progress Notes (Signed)
Patient pregnancy test negative today. Informed Dr. Tasia Catchings that patient mentioned that her menstrual cycle is 3 days late. Per Dr. Tasia Catchings, ok to proceed with Venofer infusion. Patient aware and agrees with getting Venofer. Tolerated infusion well, without any complications.

## 2022-06-23 NOTE — Patient Instructions (Signed)

## 2022-06-30 ENCOUNTER — Inpatient Hospital Stay: Payer: Medicaid Other

## 2022-06-30 ENCOUNTER — Other Ambulatory Visit (HOSPITAL_COMMUNITY)
Admission: RE | Admit: 2022-06-30 | Discharge: 2022-06-30 | Disposition: A | Discharge status: Home / Self Care | Payer: Medicaid Other | Source: Ambulatory Visit | Attending: Advanced Practice Midwife | Admitting: Advanced Practice Midwife

## 2022-06-30 ENCOUNTER — Encounter: Payer: Self-pay | Admitting: Advanced Practice Midwife

## 2022-06-30 ENCOUNTER — Ambulatory Visit (INDEPENDENT_AMBULATORY_CARE_PROVIDER_SITE_OTHER): Payer: Medicaid Other | Admitting: Advanced Practice Midwife

## 2022-06-30 VITALS — BP 131/79 | HR 70 | Temp 97.9°F | Resp 16

## 2022-06-30 VITALS — BP 100/60 | Ht 65.5 in | Wt 168.0 lb

## 2022-06-30 DIAGNOSIS — N920 Excessive and frequent menstruation with regular cycle: Secondary | ICD-10-CM | POA: Diagnosis not present

## 2022-06-30 DIAGNOSIS — Z30013 Encounter for initial prescription of injectable contraceptive: Secondary | ICD-10-CM

## 2022-06-30 DIAGNOSIS — D500 Iron deficiency anemia secondary to blood loss (chronic): Secondary | ICD-10-CM

## 2022-06-30 DIAGNOSIS — D509 Iron deficiency anemia, unspecified: Secondary | ICD-10-CM | POA: Diagnosis not present

## 2022-06-30 DIAGNOSIS — Z124 Encounter for screening for malignant neoplasm of cervix: Secondary | ICD-10-CM | POA: Diagnosis not present

## 2022-06-30 LAB — PREGNANCY, URINE: Preg Test, Ur: NEGATIVE

## 2022-06-30 MED ORDER — SODIUM CHLORIDE 0.9 % IV SOLN
Freq: Once | INTRAVENOUS | Status: AC
  Filled 2022-06-30: qty 250

## 2022-06-30 MED ORDER — MEDROXYPROGESTERONE ACETATE 150 MG/ML IM SUSP
150.0000 mg | Freq: Once | INTRAMUSCULAR | Status: AC
  Administered 2022-06-30: 150 mg via INTRAMUSCULAR

## 2022-06-30 MED ORDER — SODIUM CHLORIDE 0.9 % IV SOLN
200.0000 mg | Freq: Once | INTRAVENOUS | Status: AC
  Administered 2022-06-30: 200 mg via INTRAVENOUS
  Filled 2022-06-30: qty 200

## 2022-06-30 NOTE — Progress Notes (Addendum)
Patient ID: Shari Levine, female   DOB: 01-11-2001, 22 y.o.   MRN: [id number]  Reason for Consult: Menorrhagia   Referred by Herbert Deaner, FNP  Subjective:  HPI:  Shari Levine is a 22 y.o. female being seen to discuss cycle control options. She has always had heavy menstrual periods since age 30. Currently cycles are every 20 days lasting 5 days, 3 or 4 of which are heavy. She changes a pad every 30 minutes. She is receiving iron infusions for the past month and thinks it is helping. In the past she has used Depo (worked well) and OCPs (couldn't remember to take them) for cycle control. She has been off Depo for the past couple of years and would like to restart today.  She is not currently sexually active and denies concern for STDs. She has never had a PAP smear and agrees to having PAP today.  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   Post concussion syndrome    Family History  Problem Relation Age of Onset   Hypertension Mother    Diabetes Maternal Grandfather    Hypertension Maternal Grandfather    Heart disease Maternal Grandfather    Kidney disease Maternal Grandfather    Skin cancer Maternal Grandfather    Cancer Paternal Grandmother    Breast cancer Paternal Great-grandmother    Past Surgical History:  Procedure Laterality Date   CLOSED REDUCTION NASAL FRACTURE N/A 05/27/2015   Procedure: CLOSED REDUCTION NASAL FRACTURE;  Surgeon: Clyde Canterbury, MD;  Location: [place];  Service: ENT;  Laterality: N/A;   STRABISMUS SURGERY Bilateral    age 48   STRABISMUS SURGERY Bilateral 08/07/2015   Procedure: [place];  Surgeon: Lamonte Sakai, MD;  Location: [place];  Service: Ophthalmology;  Laterality: Bilateral;   WISDOM TOOTH EXTRACTION      Short Social History:  Social History   Tobacco Use   Smoking status: Never   Smokeless tobacco: Never  Substance Use Topics   Alcohol use: No     Allergies  Allergen Reactions   Beef-Derived Products    Aloe Rash   Green Tea [Cholestatin] Rash    Current Outpatient Medications  Medication Sig Dispense Refill   medroxyPROGESTERone (DEPO-PROVERA) 150 MG/ML injection Inject 150 mg into the muscle every 3 (three) months.     No current facility-administered medications for this visit.    Review of Systems  Constitutional:  Positive for malaise/fatigue. Negative for chills and fever.  HENT:  Positive for congestion and sore throat. Negative for ear discharge, ear pain, hearing loss and sinus pain.   Eyes:  Negative for blurred vision and double vision.  Respiratory:  Positive for shortness of breath. Negative for cough and wheezing.   Cardiovascular:  Positive for chest pain. Negative for palpitations and leg swelling.  Gastrointestinal:  Positive for diarrhea, nausea and vomiting. Negative for abdominal pain, blood in stool, constipation, heartburn and melena.  Genitourinary:  Positive for frequency. Negative for dysuria, flank pain, hematuria and urgency.  Musculoskeletal:  Positive for joint pain and myalgias. Negative for back pain.  Skin:  Positive for itching and rash.  Neurological:  Positive for dizziness, tingling and headaches. Negative for tremors, sensory change, speech change, focal weakness, seizures, loss of consciousness and weakness.  Endo/Heme/Allergies:  Positive for environmental allergies. Bruises/bleeds easily.  Psychiatric/Behavioral:  Negative for depression, hallucinations, memory loss, substance abuse and suicidal ideas. The patient is not  nervous/anxious and does not have insomnia.        Positive for anxiety       Objective:  Objective   Vitals:   06/30/22 0853  BP: 100/60  Weight: 168 lb (76.2 kg)  Height: 5' 5.5" (1.664 m)   Body mass index is 27.53 kg/m. Constitutional: Well nourished, well developed female in no acute distress.  HEENT: normal Skin: Warm and dry.  Cardiovascular:  Regular rate and rhythm.   Extremity:  no edema   Respiratory: Clear to auscultation bilateral. Normal respiratory effort Psych: Alert and Oriented x3. No memory deficits. Normal mood and affect.  MS: normal gait, normal bilateral lower extremity ROM/strength/stability.  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 female with menorrhagia, cervical cancer screening  PAP done today Depo- done in clinic today Return to clinic every 3 months for Depo injection     [facility] 06/30/2022, 12:01 PM

## 2022-06-30 NOTE — Patient Instructions (Signed)

## 2022-06-30 NOTE — Patient Instructions (Signed)
Menorrhagia Menorrhagia is a form of abnormal uterine bleeding in which menstrual periods are heavy or last longer than normal. With menorrhagia, the periods may cause enough blood loss and cramping that a woman becomes unable to take part in her usual activities. What are the causes? Common causes of this condition include: Polyps or fibroids. These are noncancerous growths in the uterus. An imbalance of the hormones estrogen and progesterone. Anovulation, which occurs when one of the ovaries does not release an egg during one or more months. A problem with the thyroid gland (hypothyroidism). Side effects of having an intrauterine device (IUD). Side effects of some medicines, such as NSAIDs or blood thinners. A bleeding disorder that stops the blood from clotting normally. In some cases, the cause of this condition is not known. What increases the risk? You are more likely to develop this condition if you have cancer of the uterus. What are the signs or symptoms? Symptoms of this condition include: Routinely having to change your pad or tampon every 1-2 hours because it is soaked. Needing to use pads and tampons at the same time because of heavy bleeding. Needing to wake up to change your pads or tampons during the night. Passing blood clots larger than 1 inch (2.5 cm) in size. Having bleeding that lasts for more than 7 days. Having symptoms of low iron levels (anemia), such as tiredness (fatigue) or shortness of breath. How is this diagnosed? This condition may be diagnosed based on: A physical exam. Your symptoms and menstrual history. Tests, such as: Blood tests to check if you are pregnant or if you have hormonal changes, a bleeding or thyroid disorder, anemia, or other problems. Pap test to check for cancerous changes, infections, or inflammation. Endometrial biopsy. This test involves removing a tissue sample from the lining of the uterus (endometrium) to be examined under a  microscope. Pelvic ultrasound. This test uses sound waves to create images of your uterus, ovaries, and vagina. The images can show if you have fibroids or other growths. Hysteroscopy. For this test, a thin, flexible tube with a light on the end (hysteroscope) is used to look inside your uterus. How is this treated? Treatment may not be needed for this condition. If it is needed, the best treatment for you will depend on: Whether you need to prevent pregnancy. Your desire to have children in the future. The cause and severity of your bleeding. Your personal preference. Medicine Medicines are the first step in treatment. You may be treated with: Hormonal birth control methods. These treatments reduce bleeding during your menstrual period. They include: Birth control pills. Skin patch. Vaginal ring. Shots (injections) that you get every 3 months. Hormonal IUD. Implants that go under the skin. Medicines that thicken the blood and slow bleeding. Medicines that reduce swelling, such as ibuprofen. Medicines that contain an artificial (synthetic) hormone called progestin. Medicines that make the ovaries stop working for a short time. Iron supplements to treat anemia.  Surgery If medicines do not work, surgery may be done. Surgical options may include: Dilation and curettage (D&C). In this procedure, your health care provider opens the lowest part of the uterus (cervix) and then scrapes or suctions tissue from the endometrium. This reduces menstrual bleeding. Operative hysteroscopy. In this procedure, a hysteroscope is used to view your uterus and help remove polyps that may be causing heavy periods. Endometrial ablation. This is when various techniques are used to permanently destroy your entire endometrium. After endometrial ablation, most women have little   or no menstrual flow. This procedure reduces your ability to become pregnant. Endometrial resection. In this procedure, an electrosurgical  wire loop is used to remove the endometrium. This procedure reduces your ability to become pregnant. Hysterectomy. This is surgical removal of your uterus. This is a permanent procedure that stops menstrual periods. Pregnancy is not possible after a hysterectomy. Follow these instructions at home: Medicines Take over-the-counter and prescription medicines only as told by your health care provider. This includes iron pills. Do not change or switch medicines without asking your health care provider. Do not take aspirin or medicines that contain aspirin 1 week before or during your menstrual period. Aspirin may make bleeding worse. Managing constipation Your iron pills may cause constipation. If you are taking prescription iron supplements, you may need to take these actions to prevent or treat constipation: Drink enough fluid to keep your urine pale yellow. Take over-the-counter or prescription medicines. Eat foods that are high in fiber, such as beans, whole grains, and fresh fruits and vegetables. Limit foods that are high in fat and processed sugars, such as fried or sweet foods. General instructions If you need to change your sanitary pad or tampon more than once every 2 hours, limit your activity until the bleeding stops. Eat well-balanced meals, including foods that are high in iron. Foods that have a lot of iron include leafy green vegetables, meat, liver, eggs, and whole-grain breads and cereals. Do not try to lose weight until the abnormal bleeding has stopped and your blood iron level is back to normal. If you need to lose weight, work with your health care provider to lose weight safely. Keep all follow-up visits. This is important. Contact a health care provider if: You soak through a pad or tampon every 1 or 2 hours, and this happens every time you have a period. You need to use pads and tampons at the same time because you are bleeding so much. You have nausea, vomiting, diarrhea, or  other problems related to medicines you are taking. Get help right away if: You soak through more than a pad or tampon in 1 hour. You pass clots bigger than 1 inch (2.5 cm) wide. You feel short of breath. You feel like your heart is beating too fast. You feel dizzy or you faint. You feel very weak or tired. Summary Menorrhagia is a form of abnormal uterine bleeding in which menstrual periods are heavy or last longer than normal. Treatment may not be needed for this condition. If it is needed, it may include medicines or procedures. Take over-the-counter and prescription medicines only as told by your health care provider. This includes iron pills. Get help right away if you have heavy bleeding that soaks through more than a pad or tampon in 1 hour, you pass large clots, or you feel dizzy, short of breath, or very weak or tired. This information is not intended to replace advice given to you by your health care provider. Make sure you discuss any questions you have with your health care provider. Document Revised: 12/04/2019 Document Reviewed: 12/04/2019 Elsevier Patient Education  2023 [employer]  

## 2022-07-02 LAB — CYTOLOGY - PAP: Diagnosis: NEGATIVE

## 2022-09-07 ENCOUNTER — Inpatient Hospital Stay: Payer: Medicaid Other | Attending: Oncology

## 2022-09-07 ENCOUNTER — Encounter: Payer: Self-pay | Admitting: Oncology

## 2022-09-07 DIAGNOSIS — Z793 Long term (current) use of hormonal contraceptives: Secondary | ICD-10-CM | POA: Diagnosis not present

## 2022-09-07 DIAGNOSIS — D509 Iron deficiency anemia, unspecified: Secondary | ICD-10-CM | POA: Insufficient documentation

## 2022-09-07 DIAGNOSIS — D500 Iron deficiency anemia secondary to blood loss (chronic): Secondary | ICD-10-CM

## 2022-09-07 DIAGNOSIS — N920 Excessive and frequent menstruation with regular cycle: Secondary | ICD-10-CM | POA: Insufficient documentation

## 2022-09-07 LAB — CBC WITH DIFFERENTIAL (CANCER CENTER ONLY)
Abs Immature Granulocytes: 0.02 10*3/uL (ref 0.00–0.07)
Basophils Absolute: 0 10*3/uL (ref 0.0–0.1)
Basophils Relative: 1 %
Eosinophils Absolute: 0.1 10*3/uL (ref 0.0–0.5)
Eosinophils Relative: 2 %
HCT: 43.4 % (ref 36.0–46.0)
Hemoglobin: 14.5 g/dL (ref 12.0–15.0)
Immature Granulocytes: 0 %
Lymphocytes Relative: 27 %
Lymphs Abs: 1.5 10*3/uL (ref 0.7–4.0)
MCH: 28.3 pg (ref 26.0–34.0)
MCHC: 33.4 g/dL (ref 30.0–36.0)
MCV: 84.8 fL (ref 80.0–100.0)
Monocytes Absolute: 0.5 10*3/uL (ref 0.1–1.0)
Monocytes Relative: 9 %
Neutro Abs: 3.2 10*3/uL (ref 1.7–7.7)
Neutrophils Relative %: 61 %
Platelet Count: 226 10*3/uL (ref 150–400)
RBC: 5.12 MIL/uL — ABNORMAL HIGH (ref 3.87–5.11)
RDW: 15.3 % (ref 11.5–15.5)
WBC Count: 5.4 10*3/uL (ref 4.0–10.5)
nRBC: 0 % (ref 0.0–0.2)

## 2022-09-07 LAB — IRON AND TIBC
Iron: 167 ug/dL (ref 28–170)
Saturation Ratios: 45 % — ABNORMAL HIGH (ref 10.4–31.8)
TIBC: 371 ug/dL (ref 250–450)
UIBC: 204 ug/dL

## 2022-09-07 LAB — FERRITIN: Ferritin: 30 ng/mL (ref 11–307)

## 2022-09-07 LAB — PREGNANCY, URINE: Preg Test, Ur: NEGATIVE

## 2022-09-07 MED FILL — Iron Sucrose Inj 20 MG/ML (Fe Equiv): INTRAVENOUS | Qty: 10 | Status: AC

## 2022-09-08 ENCOUNTER — Encounter: Payer: Self-pay | Admitting: Oncology

## 2022-09-08 ENCOUNTER — Inpatient Hospital Stay: Payer: Medicaid Other

## 2022-09-08 ENCOUNTER — Inpatient Hospital Stay (HOSPITAL_BASED_OUTPATIENT_CLINIC_OR_DEPARTMENT_OTHER): Payer: Medicaid Other | Admitting: Oncology

## 2022-09-08 VITALS — BP 132/74 | HR 70 | Temp 99.0°F | Resp 18 | Wt 168.5 lb

## 2022-09-08 DIAGNOSIS — D509 Iron deficiency anemia, unspecified: Secondary | ICD-10-CM | POA: Diagnosis not present

## 2022-09-08 DIAGNOSIS — R233 Spontaneous ecchymoses: Secondary | ICD-10-CM

## 2022-09-08 DIAGNOSIS — D500 Iron deficiency anemia secondary to blood loss (chronic): Secondary | ICD-10-CM

## 2022-09-08 LAB — PROTIME-INR
INR: 1 (ref 0.8–1.2)
Prothrombin Time: 13.8 seconds (ref 11.4–15.2)

## 2022-09-08 LAB — APTT: aPTT: 25 seconds (ref 24–36)

## 2022-09-08 NOTE — Assessment & Plan Note (Signed)
Labs are reviewed and discussed with patient. Lab Results  Component Value Date   HGB 14.5 09/07/2022   TIBC 371 09/07/2022   IRONPCTSAT 45 (H) 09/07/2022   FERRITIN 30 09/07/2022    No need for additional IV or oral iron.  Iron saturation is slightly elevated, likely due to previous iron saturation. Reassurance was provided.

## 2022-09-08 NOTE — Assessment & Plan Note (Signed)
Discussed with patient that bruising on extremities are usually benign PT, PTT are both normal. check von willebrand level.

## 2022-09-08 NOTE — Progress Notes (Signed)
Hematology/Oncology Progress note [phone] [phone]           REFERRING PROVIDER: Rickard Patience, MD   CHIEF COMPLAINTS/REASON FOR VISIT:  Iron deficiency anemia, easy bruising.    ASSESSMENT & PLAN:   IDA (iron deficiency anemia) Labs are reviewed and discussed with patient. Lab Results  Component Value Date   HGB 14.5 09/07/2022   TIBC 371 09/07/2022   IRONPCTSAT 45 (H) 09/07/2022   FERRITIN 30 09/07/2022    No need for additional IV or oral iron.  Iron saturation is slightly elevated, likely due to previous iron saturation. Reassurance was provided.   Easy bruising Discussed with patient that bruising on extremities are usually benign PT, PTT are both normal. check von willebrand level.    Orders Placed This Encounter  Procedures   Von Willebrand panel    Standing Status:   Future    Number of Occurrences:   1    Standing Expiration Date:   09/08/2023   Protime-INR    Standing Status:   Future    Number of Occurrences:   1    Standing Expiration Date:   09/08/2023   APTT    Standing Status:   Future    Number of Occurrences:   1    Standing Expiration Date:   09/08/2023   If all work up is negative, no need for routine follow up.  All questions were answered. The patient knows to call the clinic with any problems, questions or concerns.  Rickard Patience, MD, PhD [facility] Hematology Oncology 09/08/2022   HISTORY OF PRESENTING ILLNESS:   Shari Levine is a  22 y.o.  female with PMH listed below was seen in consultation at the request of  Rickard Patience, MD  for evaluation of anemia Patient recently had blood work done at primary care provider's office.  CBC showed a hemoglobin of 10.5, MCV 76. Patient reports heavy menstrual period.  She feels tired.  Denies any chest pain, palpitation, shortness of breath.  Patient has heavy menstrual period She has started on oral iron supplementation for 1 months.  Overall tolerates well.   INTERVAL  HISTORY Shari Levine is a 22 y.o. female who has above history reviewed by me today presents for follow up visit for iron deficiency anemia.  S/p IV Venofer and she tolerated well.  She is on Depo shots.  She noticed easy bruising on her bilateral thighs. She does not recall any injury.  Currently she has a few bruising on her right upper thigh. She also noticed small area of discolorations at the previous bruising area. She reports the small discoloration has been there for 2 year.  Denies hematochezia, hematuria, hematemesis, epistaxis, black tarry stool or easy bruising.  She denies any bleeding complication after previous wisdom extraction.      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   Post concussion syndrome     SURGICAL HISTORY: Past Surgical History:  Procedure Laterality Date   CLOSED REDUCTION NASAL FRACTURE N/A 05/27/2015   Procedure: CLOSED REDUCTION NASAL FRACTURE;  Surgeon: Geanie Logan, MD;  Location: [facility];  Service: ENT;  Laterality: N/A;   STRABISMUS SURGERY Bilateral    age 48   STRABISMUS SURGERY Bilateral 08/07/2015   Procedure: REPAIR STRABISMUS PEDIATRIC;  Surgeon: French Ana, MD;  Location: [facility];  Service: Ophthalmology;  Laterality: Bilateral;   WISDOM TOOTH EXTRACTION  SOCIAL HISTORY: Social History   Socioeconomic History   Marital status: Single    Spouse name: Not on file   Number of children: Not on file   Years of education: Not on file   Highest education level: Not on file  Occupational History   Not on file  Tobacco Use   Smoking status: Never   Smokeless tobacco: Never  Vaping Use   Vaping Use: Never used  Substance and Sexual Activity   Alcohol use: No   Drug use: No   Sexual activity: Not Currently  Other Topics Concern   Not on file  Social History Narrative   Right handed    Lives with family    Social Determinants of Health   Financial  Resource Strain: Not on file  Food Insecurity: No Food Insecurity (06/09/2022)   Hunger Vital Sign    Worried About Running Out of Food in the Last Year: Never true    Ran Out of Food in the Last Year: Never true  Transportation Needs: No Transportation Needs (06/09/2022)   PRAPARE - Administrator, Civil Service (Medical): No    Lack of Transportation (Non-Medical): No  Physical Activity: Not on file  Stress: Not on file  Social Connections: Not on file  Intimate Partner Violence: Not At Risk (06/09/2022)   Humiliation, Afraid, Rape, and Kick questionnaire    Fear of Current or Ex-Partner: No    Emotionally Abused: No    Physically Abused: No    Sexually Abused: No    FAMILY HISTORY: Family History  Problem Relation Age of Onset   Hypertension Mother    Diabetes Maternal Grandfather    Hypertension Maternal Grandfather    Heart disease Maternal Grandfather    Kidney disease Maternal Grandfather    Skin cancer Maternal Grandfather    Cancer Paternal Grandmother    Breast cancer Paternal Great-grandmother     ALLERGIES:  is allergic to beef-derived products, aloe, and green tea [cholestatin].  MEDICATIONS:  Current Outpatient Medications  Medication Sig Dispense Refill   medroxyPROGESTERone (DEPO-PROVERA) 150 MG/ML injection Inject 150 mg into the muscle every 3 (three) months.     No current facility-administered medications for this visit.    Review of Systems  Constitutional:  Positive for fatigue. Negative for appetite change, chills and fever.  HENT:   Negative for hearing loss and voice change.   Eyes:  Negative for eye problems.  Respiratory:  Negative for chest tightness and cough.   Cardiovascular:  Negative for chest pain.  Gastrointestinal:  Negative for abdominal distention, abdominal pain and blood in stool.  Endocrine: Negative for hot flashes.  Genitourinary:  Negative for difficulty urinating, frequency and menstrual problem.   Musculoskeletal:   Negative for arthralgias.  Skin:  Negative for itching and rash.  Neurological:  Negative for extremity weakness.  Hematological:  Negative for adenopathy. Bruises/bleeds easily.  Psychiatric/Behavioral:  Negative for confusion.    PHYSICAL EXAMINATION: Vitals:   09/08/22 1428  BP: 132/74  Pulse: 70  Resp: 18  Temp: 99 F (37.2 C)   Filed Weights   09/08/22 1428  Weight: 168 lb 8 oz (76.4 kg)    Physical Exam Constitutional:      General: She is not in acute distress. HENT:     Head: Normocephalic and atraumatic.  Eyes:     General: No scleral icterus. Cardiovascular:     Rate and Rhythm: Normal rate and regular rhythm.     Heart sounds:  Normal heart sounds.  Pulmonary:     Effort: Pulmonary effort is normal. No respiratory distress.     Breath sounds: No wheezing.  Abdominal:     General: Bowel sounds are normal. There is no distension.     Palpations: Abdomen is soft.  Musculoskeletal:        General: No deformity. Normal range of motion.     Cervical back: Normal range of motion and neck supple.  Skin:    General: Skin is warm and dry.     Findings: No erythema or rash.  Neurological:     Mental Status: She is alert and oriented to person, place, and time. Mental status is at baseline.     Cranial Nerves: No cranial nerve deficit.     Coordination: Coordination normal.  Psychiatric:        Mood and Affect: Mood normal.     LABORATORY DATA:  I have reviewed the data as listed    Latest Ref Rng & Units 09/07/2022   11:26 AM 06/09/2022   11:29 AM 07/10/2020   11:14 PM  CBC  WBC 4.0 - 10.5 K/uL 5.4  5.9  5.8   Hemoglobin 12.0 - 15.0 g/dL 16.1  09.6  04.5   Hematocrit 36.0 - 46.0 % 43.4  35.8  42.1   Platelets 150 - 400 K/uL 226  213  218       Latest Ref Rng & Units 06/09/2022   11:29 AM 07/10/2020   11:14 PM 01/17/2020    1:10 AM  CMP  Glucose 70 - 99 mg/dL 91  409  94   BUN 6 - 20 mg/dL 15  12  16    Creatinine 0.44 - 1.00 mg/dL 8.11  9.14  7.82   Sodium  135 - 145 mmol/L 137  138  142   Potassium 3.5 - 5.1 mmol/L 4.2  3.3  3.4   Chloride 98 - 111 mmol/L 105  106  106   CO2 22 - 32 mmol/L 24  23  25    Calcium 8.9 - 10.3 mg/dL 8.4  8.2  8.7   Total Protein 6.5 - 8.1 g/dL 7.2  6.8    Total Bilirubin 0.3 - 1.2 mg/dL 0.4  0.5    Alkaline Phos 38 - 126 U/L 54  76    AST 15 - 41 U/L 15  31    ALT 0 - 44 U/L 15  36        RADIOGRAPHIC STUDIES: I have personally reviewed the radiological images as listed and agreed with the findings in the report. No results found.

## 2022-09-10 LAB — VON WILLEBRAND PANEL
Coagulation Factor VIII: 149 % — ABNORMAL HIGH (ref 56–140)
Ristocetin Co-factor, Plasma: 106 % (ref 50–200)
Von Willebrand Antigen, Plasma: 138 % (ref 50–200)

## 2022-09-10 LAB — COAG STUDIES INTERP REPORT

## 2022-09-15 ENCOUNTER — Ambulatory Visit (INDEPENDENT_AMBULATORY_CARE_PROVIDER_SITE_OTHER): Payer: Medicaid Other

## 2022-09-15 VITALS — BP 114/70 | Ht 65.0 in | Wt 169.0 lb

## 2022-09-15 DIAGNOSIS — Z3042 Encounter for surveillance of injectable contraceptive: Secondary | ICD-10-CM | POA: Diagnosis not present

## 2022-09-15 MED ORDER — MEDROXYPROGESTERONE ACETATE 150 MG/ML IM SUSY
150.0000 mg | PREFILLED_SYRINGE | Freq: Once | INTRAMUSCULAR | Status: AC
Start: 2022-09-15 — End: 2022-09-15
  Administered 2022-09-15: 150 mg via INTRAMUSCULAR

## 2022-09-15 NOTE — Patient Instructions (Signed)

## 2022-09-15 NOTE — Progress Notes (Signed)
    NURSE VISIT NOTE  Subjective:    Patient ID: Shari Levine, female    DOB: 2000-07-16, 22 y.o.   MRN: [id number]  HPI  Patient is a 22 y.o. G0P0000 female who presents for depo provera injection.   Objective:    BP 114/70   Ht 5\' 5"  (1.651 m)   Wt 169 lb (76.7 kg)   BMI 28.12 kg/m   Last Annual: 06/30/22. Last pap: 06/30/22. Last Depo-Provera: 06/30/22. Side Effects if any: none. Serum HCG indicated? No . Depo-Provera 150 mg IM given by: Donnetta Hail, CMA. Site: Right Upper Outer Quandrant   Assessment:   1. Encounter for surveillance of injectable contraceptive      Plan:   Next appointment due between Aug 28 and Sept 11.    Donnetta Hail, CMA

## 2022-09-24 ENCOUNTER — Telehealth: Payer: Self-pay | Admitting: Hematology and Oncology

## 2022-09-24 NOTE — Telephone Encounter (Signed)
Patients mother called and cancelled appt on 09/27/22.Will keep the appt on 10/12/22

## 2022-09-27 ENCOUNTER — Inpatient Hospital Stay: Payer: Medicaid Other

## 2022-09-27 ENCOUNTER — Encounter: Payer: Medicaid Other | Admitting: Hematology and Oncology

## 2022-10-11 NOTE — Progress Notes (Signed)
[facility] Initial Visit:  Patient Care Team: Hamrick, Durward Fortes, MD as PCP - General (Family Medicine) Debbe Odea, MD as PCP - Cardiology (Cardiology) Van Clines, MD as Consulting Physician (Neurology)  CHIEF COMPLAINTS/PURPOSE OF CONSULTATION:   HISTORY OF PRESENTING ILLNESS: Shari Levine 22 y.o. female is here because of anemia and easy bruising Medical history is notable for hypocalcemia, easy bruising, iron deficiency anemia  June 09, 2022: WBC 5.9 hemoglobin 11.0 MCV 88 platelet count 13; 65 segs 24 lymphs 8 monos 2 eos.  Reticulocyte count 1% Ferritin 6  June 16, 2022 through June 30, 2022: Received total of 600 mg of Venofer  September 07, 2022: WBC 5.4 hemoglobin 14.5 platelet count 226; 51 segs 27 lymphs 9 monos 2 eos 1 basophil.    Factor VIII 149  von Willebrand factor antigen 138.  Ristocetin cofactor 106 INR 1.0 PTT 25 Ferritin 30  October 12, 2022: Hematology follow-up  Patient is G0 P0.  Menopause not reached.  Prior to starting Depo about 4 months ago menses occurred monthly and lasted 4 days and were regular.  Bleeding was very heavy.  Did not have bleeding between periods.    For the first month after Depot spotted daily but now amenorrheic.  Patient does not have history of uterine fibroids or uterine abnormalities.  Has taken oral iron which she could not tolerate from a GI standpoint.  Has received IV iron. Has not required PRBC's in the past.  No reaction to IV iron.  Has a normal diet.  Was as regular blood donor until mid 2023.  No hematochezia, melena, hemoptysis, hematuria.  Has history of mild epistaxis.  No history of intra-articular or soft tissue bleeding.  No history of abnormal bleeding in family members.  Still having fatigue, DOE, decreased performance status.  Patient has pica ice and raw potatoes.    Social:  Works at [employer]  Tobacco none.  EtOH none.      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   Post concussion syndrome     SURGICAL HISTORY: Past Surgical History:  Procedure Laterality Date   CLOSED REDUCTION NASAL FRACTURE N/A 05/27/2015   Procedure: CLOSED REDUCTION NASAL FRACTURE;  Surgeon: Geanie Logan, MD;  Location: [facility];  Service: ENT;  Laterality: N/A;   STRABISMUS SURGERY Bilateral    age 15   STRABISMUS SURGERY Bilateral 08/07/2015   Procedure: REPAIR STRABISMUS PEDIATRIC;  Surgeon: French Ana, MD;  Location: [facility];  Service: Ophthalmology;  Laterality: Bilateral;   WISDOM TOOTH EXTRACTION      SOCIAL HISTORY: Social History   Socioeconomic History   Marital status: Single    Spouse name: Not on file   Number of children: Not on file   Years of education: Not on file   Highest education level: Not on file  Occupational History   Not on file  Tobacco Use   Smoking status: Never   Smokeless tobacco: Never  Vaping Use   Vaping status: Never Used  Substance and Sexual Activity   Alcohol use: No   Drug use: No   Sexual activity: Not Currently    Birth control/protection: Injection  Other Topics Concern   Not on file  Social History Narrative   Right handed    Lives with family    Social Determinants of Health   Financial Resource Strain: Low  Risk  (02/22/2020)   Received from [facility], [facility]   Overall Financial Resource Strain (CARDIA)    Difficulty of Paying Living Expenses: Not hard at all  Food Insecurity: No Food Insecurity (06/09/2022)   Hunger Vital Sign    Worried About Running Out of Food in the Last Year: Never true    Ran Out of Food in the Last Year: Never true  Transportation Needs: No Transportation Needs (06/09/2022)   PRAPARE - Administrator, Civil Service (Medical): No    Lack of Transportation (Non-Medical): No  Physical Activity: Inactive (02/22/2020)   Received from [facility], [facility]   Exercise Vital Sign    Days of Exercise per Week: 0 days    Minutes of Exercise per Session: 0 min  Stress: Stress Concern Present (02/22/2020)   Received from [facility], [facility]   [employer] of Occupational Health - Occupational Stress Questionnaire    Feeling of Stress : Rather much  Social Connections: Moderately Isolated (02/22/2020)   Received from [facility], [facility]   Social Connection and Isolation Panel [NHANES]    Frequency of Communication with Friends and Family: More than three times a week    Frequency of Social Gatherings with Friends and Family: More than three times a week    Attends Religious Services: More than 4 times per year    Active Member of [employer] or Organizations: No    Attends Banker Meetings: Never    Marital Status: Never married  Intimate Partner Violence: Not At Risk (06/09/2022)   Humiliation, Afraid, Rape, and Kick questionnaire    Fear of Current or Ex-Partner: No    Emotionally Abused: No    Physically Abused: No    Sexually Abused: No    FAMILY HISTORY Family History  Problem Relation Age of Onset   Hypertension Mother    Diabetes Maternal Grandfather    Hypertension Maternal Grandfather    Heart disease Maternal Grandfather    Kidney disease Maternal Grandfather    Skin cancer Maternal Grandfather    Cancer Paternal Grandmother    Breast cancer Paternal Great-grandmother     ALLERGIES:  is allergic to beef-derived products, aloe, and green tea [cholestatin].  MEDICATIONS:  Current Outpatient Medications  Medication Sig Dispense Refill   medroxyPROGESTERone (DEPO-PROVERA) 150 MG/ML injection Inject 150 mg into the muscle every 3 (three) months.     No current facility-administered medications for this visit.    PHYSICAL EXAMINATION:  ECOG PERFORMANCE STATUS: 1 - Symptomatic but  completely ambulatory   Vitals:   10/12/22 1558  BP: (!) 140/70  Pulse: 89  Resp: 12  Temp: 98.6 F (37 C)  SpO2: 100%    Filed Weights   10/12/22 1558  Weight: 173 lb 8 oz (78.7 kg)     Physical Exam Vitals and nursing note reviewed.  Constitutional:      Appearance: Normal appearance. She is not toxic-appearing or diaphoretic.     Comments: Here with grandmother.    HENT:     Head: Normocephalic and atraumatic.     Right Ear: External ear normal.     Left Ear: External ear normal.     Nose: Nose normal. No congestion or rhinorrhea.  Eyes:     General: No scleral icterus.    Extraocular Movements: Extraocular movements intact.     Conjunctiva/sclera: Conjunctivae  normal.     Pupils: Pupils are equal, round, and reactive to light.  Cardiovascular:     Rate and Rhythm: Normal rate.     Heart sounds: No murmur heard.    No friction rub. No gallop.  Abdominal:     General: Bowel sounds are normal.     Palpations: Abdomen is soft.  Musculoskeletal:        General: No swelling, tenderness or deformity.     Cervical back: Normal range of motion and neck supple. No rigidity or tenderness.  Lymphadenopathy:     Head:     Right side of head: No submental, submandibular, tonsillar, preauricular, posterior auricular or occipital adenopathy.     Left side of head: No submental, submandibular, tonsillar, preauricular, posterior auricular or occipital adenopathy.     Cervical: No cervical adenopathy.     Right cervical: No superficial, deep or posterior cervical adenopathy.    Left cervical: No superficial, deep or posterior cervical adenopathy.     Upper Body:     Right upper body: No supraclavicular, axillary, pectoral or epitrochlear adenopathy.     Left upper body: No supraclavicular, axillary, pectoral or epitrochlear adenopathy.  Skin:    General: Skin is warm.     Coloration: Skin is not jaundiced.  Neurological:     General: No focal deficit present.     Mental  Status: She is alert and oriented to person, place, and time. Mental status is at baseline.     Cranial Nerves: No cranial nerve deficit.  Psychiatric:        Mood and Affect: Mood normal.        Behavior: Behavior normal.        Thought Content: Thought content normal.        Judgment: Judgment normal.     LABORATORY DATA: I have personally reviewed the data as listed:  No visits with results within 1 Month(s) from this visit.  Latest known visit with results is:  Clinical Support on 09/08/2022  Component Date Value Ref Range Status   aPTT 09/08/2022 25  24 - 36 seconds Final   Performed at [facility], [address] [place], [place] [address]   Prothrombin Time 09/08/2022 13.8  11.4 - 15.2 seconds Final   INR 09/08/2022 1.0  0.8 - 1.2 Final   Comment: (NOTE) INR goal varies based on device and disease states. Performed at [facility], [address] [place], [place] [address]    Coagulation Factor VIII 09/08/2022 149 (H)  56 - 140 % Final   Ristocetin Co-factor, Plasma 09/08/2022 106  50 - 200 % Final   Comment: (NOTE) Performed At: [facility] [address] [place], [place] [id number] Jolene Schimke MD WG:[id number]    Von Willebrand Antigen, Plasma 09/08/2022 138  50 - 200 % Final   Comment: (NOTE) This test was developed and its performance characteristics determined by [facility]. It has not been cleared or approved by the Food and Drug Administration.    Interpretation 09/08/2022 Note   Final   Comment: (NOTE) ------------------------------- COAGULATION: VON WILLEBRAND FACTOR ASSESSMENT CURRENT RESULTS ASSESSMENT The VWF:Ag is normal. The VWF:RCo is normal. The FVIII is elevated. VON WILLEBRAND FACTOR ASSESSMENT CURRENT RESULTS INTERPRETATION - These results are not consistent with a diagnosis of VWD according to the current NHLBI guideline. Persistently elevated FVIII activity is a risk factor for venous thrombosis as well as  recurrence of venous thrombosis. Risk is graded and increases with the  degree of elevation. Although elevated FVIII activity has been identified to cluster within families, a genetic basis for the elevation has not yet been elucidated (Br J Haematol. 2012; 157(6):653-663). VON WILLEBRAND FACTOR ASSESSMENT - Results may be falsely elevated and possibly falsely normal as VWF and FVIII may increase in pregnancy, in samples drawn from patients (particularly children) who are visibly stressed at the time of phlebotomy, as acute phase reactants                          , or in response to certain drug therapies such as desmopressin. Repeat testing may be necessary before excluding a diagnosis of VWD especially if the clinical suspicion is high for an underlying bleeding disorder. The setting for phlebotomy should be as calm as possible and patients should be encouraged to sit quietly prior to the blood draw. VON WILLEBRAND FACTOR ASSESSMENT DEFINITIONS - VWD - von Willebrand disease; VWF - von Willebrand factor; VWF:Ag - VWF antigen; VWF:RCo - VWF ristocetin cofactor activity; FVIII - factor VIII activity. MEDICAL DIRECTOR: For questions regarding panel interpretation, please contact Lebron Conners, M.D. at [facility] at [phone]. ------------------------------- DISCLAIMER These assessments and interpretations are provided as a convenience in support of the physician-patient relationship and are not intended to replace the physician's clinical judgment. They are derived from national guidelines in addition                           to other evidence and expert opinion. The clinician should consider this information within the context of clinical opinion and the individual patient. SEE GUIDANCE FOR VON WILLEBRAND FACTOR ASSESSMENT: (1) The National Heart, Lung and Blood Institute. The Diagnosis, Evaluation and Management of von Willebrand Disease. Lafe Garin,  MD: [employer] Publication [phone]. 2007. Available at http://kemp.com/. (2) Annie Sable et al. Othella Boyer J Hematol. 2009; 84(6):366-370. (3) Laffan M et al. Haemophilia. 2004;10(3):199-217. (4) Pasi KJ et al. Haemophilia. 2004; 10(3):218-231. Performed At: [facility] [address] [place], [place] [id number] Blanchie Serve MD LK:[id number]     RADIOGRAPHIC STUDIES: I have personally reviewed the radiological images as listed and agree with the findings in the report  No results found.  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    Dysfunctional uterine bleeding:  This is characterized by menses that are prolonged, irregular as well as by heavy bleeding with passage of clots.   September 07 2022- Factor VIII 149  von Willebrand factor antigen 138.  Ristocetin cofactor 106 INR 1.0 PTT 25  October 12 2022- Improved with depo provera.    Therapeutics:  Patient has stopped donating blood.  Since patient has symptomatic anemia with Hgb < 10  and has not tolerated/been compliant with oral iron will arrange for IV iron replacement.   Given the severe symptoms we prefer to replete iron stores in one or two visits rather than over the course of several months.  In addition ongoing blood loss exceeds the capacity of oral iron to meet needs. A discussion regarding risks was had with the patient.  IV iron has the potential to cause allergic reactions, including potentially life-threatening anaphylaxis.   IV iron may be associated with non-allergic infusion reactions including self-limiting urticaria, palpitations, dizziness, and neck and  back spasm; generally, these occur in <1 percent of individuals and do not progress to more serious reactions. The non-allergic  reaction consisting of flushing of the face and myalgias of the chest and back.   After discussion of the risks and benefits of IV iron therapy patient has elected to proceed with parenteral iron therapy.    Easy bruising  September 07 2022- Factor VIII 149  von Willebrand factor antigen 138.  Ristocetin cofactor 106 INR 1.0 PTT 25, PLT 226  October 12 2022- In setting of minor bruising will follow    Cancer Staging  No matching staging information was found for the patient.    No problem-specific Assessment & Plan notes found for this encounter.    Orders Placed This Encounter  Procedures   Ferritin    Standing Status:   Future    Number of Occurrences:   1    Standing Expiration Date:   10/12/2023   CBC with Differential/Platelet   Vitamin B12    Standing Status:   Future    Number of Occurrences:   1    Standing Expiration Date:   10/12/2023    40  minutes was spent in patient care.  This included time spent preparing to see the patient (e.g., review of tests), obtaining and/or reviewing separately obtained history, counseling and educating the patient/family/caregiver, ordering medications, tests, or procedures; documenting clinical information in the electronic or other health record, independently interpreting results and communicating results to the patient/family/caregiver as well as coordination of care.       All questions were answered. The patient knows to call the clinic with any problems, questions or concerns.  This note was electronically signed.    Loni Muse, MD  10/26/2022 8:17 AM

## 2022-10-12 ENCOUNTER — Inpatient Hospital Stay: Payer: Medicaid Other | Attending: Oncology | Admitting: Oncology

## 2022-10-12 VITALS — BP 140/70 | HR 89 | Temp 98.6°F | Resp 12 | Ht 65.0 in | Wt 173.5 lb

## 2022-10-12 DIAGNOSIS — Z52008 Unspecified donor, other blood: Secondary | ICD-10-CM | POA: Diagnosis not present

## 2022-10-12 DIAGNOSIS — D500 Iron deficiency anemia secondary to blood loss (chronic): Secondary | ICD-10-CM

## 2022-10-12 DIAGNOSIS — N938 Other specified abnormal uterine and vaginal bleeding: Secondary | ICD-10-CM

## 2022-10-12 DIAGNOSIS — D509 Iron deficiency anemia, unspecified: Secondary | ICD-10-CM | POA: Insufficient documentation

## 2022-10-12 DIAGNOSIS — R233 Spontaneous ecchymoses: Secondary | ICD-10-CM | POA: Diagnosis not present

## 2022-10-18 ENCOUNTER — Inpatient Hospital Stay: Payer: Medicaid Other

## 2022-10-18 DIAGNOSIS — Z52008 Unspecified donor, other blood: Secondary | ICD-10-CM

## 2022-10-18 DIAGNOSIS — D500 Iron deficiency anemia secondary to blood loss (chronic): Secondary | ICD-10-CM

## 2022-10-18 DIAGNOSIS — N938 Other specified abnormal uterine and vaginal bleeding: Secondary | ICD-10-CM | POA: Diagnosis not present

## 2022-10-18 DIAGNOSIS — D509 Iron deficiency anemia, unspecified: Secondary | ICD-10-CM | POA: Diagnosis present

## 2022-10-18 LAB — CBC WITH DIFFERENTIAL (CANCER CENTER ONLY)
Abs Immature Granulocytes: 0.02 10*3/uL (ref 0.00–0.07)
Basophils Absolute: 0 10*3/uL (ref 0.0–0.1)
Basophils Relative: 1 %
Eosinophils Absolute: 0.2 10*3/uL (ref 0.0–0.5)
Eosinophils Relative: 2 %
HCT: 42 % (ref 36.0–46.0)
Hemoglobin: 14.1 g/dL (ref 12.0–15.0)
Immature Granulocytes: 0 %
Lymphocytes Relative: 24 %
Lymphs Abs: 2.1 10*3/uL (ref 0.7–4.0)
MCH: 29.8 pg (ref 26.0–34.0)
MCHC: 33.6 g/dL (ref 30.0–36.0)
MCV: 88.8 fL (ref 80.0–100.0)
Monocytes Absolute: 0.7 10*3/uL (ref 0.1–1.0)
Monocytes Relative: 8 %
Neutro Abs: 5.6 10*3/uL (ref 1.7–7.7)
Neutrophils Relative %: 65 %
Platelet Count: 245 10*3/uL (ref 150–400)
RBC: 4.73 MIL/uL (ref 3.87–5.11)
RDW: 13.2 % (ref 11.5–15.5)
WBC Count: 8.5 10*3/uL (ref 4.0–10.5)
nRBC: 0 % (ref 0.0–0.2)

## 2022-10-18 LAB — FERRITIN: Ferritin: 28 ng/mL (ref 11–307)

## 2022-10-18 LAB — VITAMIN B12: Vitamin B-12: 189 pg/mL (ref 180–914)

## 2022-10-19 ENCOUNTER — Telehealth: Payer: Self-pay | Admitting: Oncology

## 2022-10-19 NOTE — Telephone Encounter (Signed)
Contacted pt to schedule an appt. Unable to reach via phone, voicemail was left.   Follow-up disposition: Return in about 4 weeks (around 11/09/2022) for physician, labs, infusion.  Check out comments: Please arrange for patient to receive IV iron

## 2022-10-21 ENCOUNTER — Telehealth: Payer: Self-pay | Admitting: Oncology

## 2022-10-21 NOTE — Telephone Encounter (Signed)
10/21/22 Spoke with patient and she stated to cancel IV IRON.Schedule appt on 11/15/22 for F/U AND LAB APPTS.

## 2022-10-21 NOTE — Telephone Encounter (Signed)
error 

## 2022-10-26 ENCOUNTER — Encounter: Payer: Self-pay | Admitting: Oncology

## 2022-10-26 DIAGNOSIS — N938 Other specified abnormal uterine and vaginal bleeding: Secondary | ICD-10-CM | POA: Insufficient documentation

## 2022-11-12 NOTE — Progress Notes (Signed)
[facility] Cancer Initial Visit:  Patient Care Team: Hamrick, Durward Fortes, MD as PCP - General (Family Medicine) Debbe Odea, MD as PCP - Cardiology (Cardiology) Van Clines, MD as Consulting Physician (Neurology)  CHIEF COMPLAINTS/PURPOSE OF CONSULTATION:   HISTORY OF PRESENTING ILLNESS: Shari Levine 22 y.o. female is here because of anemia and easy bruising Medical history is notable for hypocalcemia, easy bruising, iron deficiency anemia  June 09, 2022: WBC 5.9 hemoglobin 11.0 MCV 88 platelet count 13; 65 segs 24 lymphs 8 monos 2 eos.  Reticulocyte count 1% Ferritin 6  June 16, 2022 through June 30, 2022: Received total of 600 mg of Venofer  September 07, 2022: WBC 5.4 hemoglobin 14.5 platelet count 226; 51 segs 27 lymphs 9 monos 2 eos 1 basophil.    Factor VIII 149  von Willebrand factor antigen 138.  Ristocetin cofactor 106 INR 1.0 PTT 25 Ferritin 30  October 12, 2022: Hematology follow-up  Patient is G0 P0.  Menopause not reached.  Prior to starting Depo about 4 months ago menses occurred monthly and lasted 4 days and were regular.  Bleeding was very heavy.  Did not have bleeding between periods.    For the first month after Depot spotted daily but now amenorrheic.  Patient does not have history of uterine fibroids or uterine abnormalities.  Has taken oral iron which she could not tolerate from a GI standpoint.  Has received IV iron. Has not required PRBC's in the past.  No reaction to IV iron.  Has a normal diet.  Was as regular blood donor until mid 2023.  No hematochezia, melena, hemoptysis, hematuria.  Has history of mild epistaxis.  No history of intra-articular or soft tissue bleeding.  No history of abnormal bleeding in family members.  Still having fatigue, DOE, decreased performance status.  Patient has pica ice and raw potatoes.    Social:  Works at [employer]  Tobacco none.  EtOH none.      October 18, 2022: WBC 8.5 hemoglobin 14.1 MCV 89 platelet count  245; 65 segs 24 lymphs 8 monos 2 eos 1 Baso. Ferritin 28 B12 189  November 15, 2022: Scheduled follow-up for management of iron deficiency.  Has not received IV iron since last visit because she elected not to proceed given that her Hgb was normal.  Feels a bit fatigued.  Still has pica to ice and raw potatoes.  Did not start oral iron because she was concerned about GI side effects.  Discussed low dose oral iron.  Not donating blood.      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   Post concussion syndrome     SURGICAL HISTORY: Past Surgical History:  Procedure Laterality Date   CLOSED REDUCTION NASAL FRACTURE N/A 05/27/2015   Procedure: CLOSED REDUCTION NASAL FRACTURE;  Surgeon: Geanie Logan, MD;  [facility];  Service: ENT;  Laterality: N/A;   STRABISMUS SURGERY Bilateral    age 11   STRABISMUS SURGERY Bilateral 08/07/2015   Procedure: REPAIR STRABISMUS PEDIATRIC;  Surgeon: French Ana, MD;  Location: [facility];  Service: Ophthalmology;  Laterality: Bilateral;   WISDOM TOOTH EXTRACTION      SOCIAL HISTORY: Social History   Socioeconomic History   Marital status: Single    Spouse name: Not on file   Number of children: Not on file   Years of education: Not on file  Highest education level: Not on file  Occupational History   Not on file  Tobacco Use   Smoking status: Never   Smokeless tobacco: Never  Vaping Use   Vaping status: Never Used  Substance and Sexual Activity   Alcohol use: No   Drug use: No   Sexual activity: Not Currently    Birth control/protection: Injection  Other Topics Concern   Not on file  Social History Narrative   Right handed    Lives with family    Social Determinants of Health   Financial Resource Strain: Low Risk  (02/22/2020)   Received from [facility], [facility]   Overall Financial Resource  Strain (CARDIA)    Difficulty of Paying Living Expenses: Not hard at all  Food Insecurity: No Food Insecurity (06/09/2022)   Hunger Vital Sign    Worried About Running Out of Food in the Last Year: Never true    Ran Out of Food in the Last Year: Never true  Transportation Needs: No Transportation Needs (06/09/2022)   PRAPARE - Administrator, Civil Service (Medical): No    Lack of Transportation (Non-Medical): No  Physical Activity: Inactive (02/22/2020)   Received from [facility], [facility]   Exercise Vital Sign    Days of Exercise per Week: 0 days    Minutes of Exercise per Session: 0 min  Stress: Stress Concern Present (02/22/2020)   Received from [facility], [facility]   [employer] of Occupational Health - Occupational Stress Questionnaire    Feeling of Stress : Rather much  Social Connections: Moderately Isolated (02/22/2020)   Received from [facility], [facility]   Social Connection and Isolation Panel [NHANES]    Frequency of Communication with Friends and Family: More than three times a week    Frequency of Social Gatherings with Friends and Family: More than three times a week    Attends Religious Services: More than 4 times per year    Active Member of [employer] or Organizations: No    Attends Banker Meetings: Never    Marital Status: Never married  Intimate Partner Violence: Not At Risk (06/09/2022)   Humiliation, Afraid, Rape, and Kick questionnaire    Fear of Current or Ex-Partner: No    Emotionally Abused: No    Physically Abused: No    Sexually Abused: No    FAMILY HISTORY Family History  Problem Relation Age of Onset   Hypertension Mother    Diabetes Maternal Grandfather    Hypertension Maternal Grandfather    Heart disease Maternal Grandfather    Kidney disease Maternal Grandfather    Skin cancer Maternal Grandfather     Cancer Paternal Grandmother    Breast cancer Paternal Great-grandmother     ALLERGIES:  is allergic to beef-derived products, aloe, and green tea [cholestatin].  MEDICATIONS:  Current Outpatient Medications  Medication Sig Dispense Refill   medroxyPROGESTERone (DEPO-PROVERA) 150 MG/ML injection Inject 150 mg into the muscle every 3 (three) months.     No current facility-administered medications for this visit.    PHYSICAL EXAMINATION:  ECOG PERFORMANCE STATUS: 1 - Symptomatic but completely ambulatory   There were no vitals filed for this visit.   There were no vitals filed for this visit.    Physical Exam Vitals and nursing note reviewed.  Constitutional:      Appearance: Normal appearance. She is not toxic-appearing  or diaphoretic.     Comments: Here with grandmother.    HENT:     Head: Normocephalic and atraumatic.     Right Ear: External ear normal.     Left Ear: External ear normal.     Nose: Nose normal. No congestion or rhinorrhea.  Eyes:     General: No scleral icterus.    Extraocular Movements: Extraocular movements intact.     Conjunctiva/sclera: Conjunctivae normal.     Pupils: Pupils are equal, round, and reactive to light.  Cardiovascular:     Rate and Rhythm: Normal rate.     Heart sounds: No murmur heard.    No friction rub. No gallop.  Abdominal:     General: Bowel sounds are normal.     Palpations: Abdomen is soft.  Musculoskeletal:        General: No swelling, tenderness or deformity.     Cervical back: Normal range of motion and neck supple. No rigidity or tenderness.  Lymphadenopathy:     Head:     Right side of head: No submental, submandibular, tonsillar, preauricular, posterior auricular or occipital adenopathy.     Left side of head: No submental, submandibular, tonsillar, preauricular, posterior auricular or occipital adenopathy.     Cervical: No cervical adenopathy.     Right cervical: No superficial, deep or posterior cervical  adenopathy.    Left cervical: No superficial, deep or posterior cervical adenopathy.     Upper Body:     Right upper body: No supraclavicular, axillary, pectoral or epitrochlear adenopathy.     Left upper body: No supraclavicular, axillary, pectoral or epitrochlear adenopathy.  Skin:    General: Skin is warm.     Coloration: Skin is not jaundiced.  Neurological:     General: No focal deficit present.     Mental Status: She is alert and oriented to person, place, and time. Mental status is at baseline.     Cranial Nerves: No cranial nerve deficit.  Psychiatric:        Mood and Affect: Mood normal.        Behavior: Behavior normal.        Thought Content: Thought content normal.        Judgment: Judgment normal.    LABORATORY DATA: I have personally reviewed the data as listed:  Appointment on 10/18/2022  Component Date Value Ref Range Status   Vitamin B-12 10/18/2022 189  180 - 914 pg/mL Final   Comment: (NOTE) This assay is not validated for testing neonatal or myeloproliferative syndrome specimens for Vitamin B12 levels. Performed at [facility], [address], [place], [place] [address]    Ferritin 10/18/2022 28  11 - 307 ng/mL Final   Performed at [facility], [address] [place], [place] [address]   WBC Count 10/18/2022 8.5  4.0 - 10.5 K/uL Final   RBC 10/18/2022 4.73  3.87 - 5.11 MIL/uL Final   Hemoglobin 10/18/2022 14.1  12.0 - 15.0 g/dL Final   HCT 09/81/1914 42.0  36.0 - 46.0 % Final   MCV 10/18/2022 88.8  80.0 - 100.0 fL Final   MCH 10/18/2022 29.8  26.0 - 34.0 pg Final   MCHC 10/18/2022 33.6  30.0 - 36.0 g/dL Final   RDW 78/29/5621 13.2  11.5 - 15.5 % Final   Platelet Count 10/18/2022 245  150 - 400 K/uL Final   nRBC 10/18/2022 0.0  0.0 - 0.2 % Final   Neutrophils Relative % 10/18/2022 65  %  Final   Neutro Abs 10/18/2022 5.6  1.7 - 7.7 K/uL Final   Lymphocytes Relative 10/18/2022 24  % Final   Lymphs Abs 10/18/2022  2.1  0.7 - 4.0 K/uL Final   Monocytes Relative 10/18/2022 8  % Final   Monocytes Absolute 10/18/2022 0.7  0.1 - 1.0 K/uL Final   Eosinophils Relative 10/18/2022 2  % Final   Eosinophils Absolute 10/18/2022 0.2  0.0 - 0.5 K/uL Final   Basophils Relative 10/18/2022 1  % Final   Basophils Absolute 10/18/2022 0.0  0.0 - 0.1 K/uL Final   Immature Granulocytes 10/18/2022 0  % Final   Abs Immature Granulocytes 10/18/2022 0.02  0.00 - 0.07 K/uL Final   Performed at [facility], [address] [place], [place] [address]    RADIOGRAPHIC STUDIES: I have personally reviewed the radiological images as listed and agree with the findings in the report  No results found.  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    October 18 2022-  Hgb 14.1 Ferriin 28 B12 189   November 15 2022- Still with pica but not donating blood and menstrual bleeding improved.  Instructed patient to begin low dose iron.  Will also have her start sublingual B12.    Dysfunctional uterine bleeding:  This is characterized by menses that are prolonged, irregular as well as by heavy bleeding with passage of clots.   September 07 2022- Factor VIII 149  von Willebrand factor antigen 138.  Ristocetin cofactor 106 INR 1.0 PTT 25  October 12 2022- Improved with depo provera.    Therapeutics:  Patient has stopped donating blood.  Since patient has symptomatic anemia with Hgb < 10  and has not tolerated/been compliant with oral iron will arrange for IV iron replacement.   Given the severe symptoms we prefer to replete iron stores in one or two visits rather than over the course of several months.  In addition ongoing blood loss exceeds the capacity of oral iron to meet needs. A discussion regarding risks was had with the patient.  IV iron  has the potential to cause allergic reactions, including potentially life-threatening anaphylaxis.   IV iron may be associated with non-allergic infusion reactions including self-limiting urticaria, palpitations, dizziness, and neck and back spasm; generally, these occur in <1 percent of individuals and do not progress to more serious reactions. The non-allergic reaction consisting of flushing of the face and myalgias of the chest and back.   After discussion of the risks and benefits of IV iron therapy patient has elected to proceed with parenteral iron therapy.    Easy bruising  September 07 2022- Factor VIII 149  von Willebrand factor antigen 138.  Ristocetin cofactor 106 INR 1.0 PTT 25, PLT 226  October 12 2022- In setting of minor bruising will follow    Cancer Staging  No matching staging information was found for the patient.    No problem-specific Assessment & Plan notes found for this encounter.    No orders of the defined types were placed in this encounter.   40  minutes was spent in patient care.  This included time spent preparing to see the patient (e.g., review of tests), obtaining and/or reviewing separately obtained history, counseling and educating the patient/family/caregiver, ordering medications, tests, or procedures; documenting clinical information in the electronic or  other health record, independently interpreting results and communicating results to the patient/family/caregiver as well as coordination of care.       All questions were answered. The patient knows to call the clinic with any problems, questions or concerns.  This note was electronically signed.    Loni Muse, MD  11/12/2022 5:43 PM

## 2022-11-15 ENCOUNTER — Inpatient Hospital Stay: Payer: Medicaid Other | Attending: Oncology | Admitting: Oncology

## 2022-11-15 ENCOUNTER — Inpatient Hospital Stay: Payer: Medicaid Other

## 2022-11-15 VITALS — BP 114/69 | HR 87 | Temp 97.9°F | Resp 16 | Ht 65.0 in | Wt 178.0 lb

## 2022-11-15 DIAGNOSIS — N938 Other specified abnormal uterine and vaginal bleeding: Secondary | ICD-10-CM | POA: Diagnosis not present

## 2022-11-15 DIAGNOSIS — D500 Iron deficiency anemia secondary to blood loss (chronic): Secondary | ICD-10-CM | POA: Diagnosis not present

## 2022-11-15 DIAGNOSIS — Z52008 Unspecified donor, other blood: Secondary | ICD-10-CM | POA: Diagnosis not present

## 2022-11-15 NOTE — Patient Instructions (Signed)
Please begin either a children's or women's multivitamin with iron daily

## 2022-11-18 ENCOUNTER — Telehealth: Payer: Self-pay | Admitting: Oncology

## 2022-11-18 NOTE — Telephone Encounter (Signed)
Contacted pt to schedule an appt. Unable to reach via phone, voicemail was left.    Follow-Up Information  Follow-up disposition: Return in about 3 months (around 02/15/2023) for physician, labs.

## 2022-12-07 ENCOUNTER — Encounter: Payer: Self-pay | Admitting: Oncology

## 2022-12-07 ENCOUNTER — Other Ambulatory Visit: Payer: Self-pay

## 2022-12-07 ENCOUNTER — Telehealth: Payer: Self-pay

## 2022-12-07 MED ORDER — B-12 1000 MCG SL SUBL: 1.0000 | SUBLINGUAL_TABLET | Freq: Every day | SUBLINGUAL | Status: AC

## 2022-12-07 NOTE — Telephone Encounter (Signed)
Patient advised to start B12. OTC B12 added to patient's medication list.

## 2022-12-07 NOTE — Telephone Encounter (Signed)
-----   Message from Loni Muse sent at 12/07/2022 11:37 AM EDT ----- Please have patient begin Vitamin B12 1000 mcg sublingual daily Thanks

## 2022-12-08 ENCOUNTER — Ambulatory Visit: Payer: Medicaid Other

## 2022-12-14 ENCOUNTER — Encounter: Payer: Self-pay | Admitting: Oncology

## 2022-12-14 ENCOUNTER — Telehealth: Payer: Self-pay | Admitting: Oncology

## 2022-12-14 NOTE — Telephone Encounter (Signed)
Created in error

## 2022-12-27 ENCOUNTER — Encounter: Payer: Self-pay | Admitting: Cardiology

## 2022-12-27 ENCOUNTER — Ambulatory Visit: Payer: Medicaid Other | Attending: Cardiology | Admitting: Cardiology

## 2022-12-27 VITALS — BP 112/60 | HR 61 | Ht 65.5 in | Wt 181.2 lb

## 2022-12-27 DIAGNOSIS — R072 Precordial pain: Secondary | ICD-10-CM

## 2022-12-27 DIAGNOSIS — R002 Palpitations: Secondary | ICD-10-CM

## 2022-12-27 DIAGNOSIS — R0609 Other forms of dyspnea: Secondary | ICD-10-CM

## 2022-12-27 MED ORDER — IVABRADINE HCL 5 MG PO TABS: 15.0000 mg | ORAL_TABLET | Freq: Once | ORAL | 0 refills | Status: AC
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
## 2022-12-27 NOTE — Patient Instructions (Signed)
Medication Instructions:   Your physician recommends that you continue on your current medications as directed. Please refer to the Current Medication list given to you today.  *If you need a refill on your cardiac medications before your next appointment, please call your pharmacy*   Lab Work:  Your physician recommends you have labs - LIPID, Lipoprotein A, BMP  If you have labs (blood work) drawn today and your tests are completely normal, you will receive your results only by: MyChart Message (if you have MyChart) OR A paper copy in the mail If you have any lab test that is abnormal or we need to change your treatment, we will call you to review the results.   Testing/Procedures:  Your physician has requested that you have an echocardiogram. Echocardiography is a painless test that uses sound waves to create images of your heart. It provides your doctor with information about the size and shape of your heart and how well your heart's chambers and valves are working. This procedure takes approximately one hour. There are no restrictions for this procedure. Please do NOT wear cologne, perfume, aftershave, or lotions (deodorant is allowed). Please arrive 15 minutes prior to your appointment time.    Your cardiac CT will be scheduled at one of the below locations:   [facility] [address] [place], [place] [address] [phone]  OR   [facility] [address] [place], [place] [address] [phone]  Please follow these instructions carefully (unless otherwise directed):  An IV will be required for this test and Nitroglycerin will be given.  Hold all erectile dysfunction medications at least 3 days (72 hrs) prior to test. (Ie viagra, cialis, sildenafil, tadalafil, etc)   On the Night Before the Test: Be sure to Drink plenty of water. Do not consume any caffeinated/decaffeinated beverages or  chocolate 12 hours prior to your test. Do not take any antihistamines 12 hours prior to your test.  On the Day of the Test: Drink plenty of water until 1 hour prior to the test. Do not eat any food 1 hour prior to test. You may take your regular medications prior to the test.  Take Corlanor 15mg  by mouth TWO HOURS PRIOR TO CARDIAC CTA FEMALES- please wear underwire-free bra if available, avoid dresses & tight clothing      After the Test: Drink plenty of water. After receiving IV contrast, you may experience a mild flushed feeling. This is normal. On occasion, you may experience a mild rash up to 24 hours after the test. This is not dangerous. If this occurs, you can take Benadryl 25 mg and increase your fluid intake. If you experience trouble breathing, this can be serious. If it is severe call 911 IMMEDIATELY. If it is mild, please call our office. If you take any of these medications: Glipizide/Metformin, Avandament, Glucavance, please do not take 48 hours after completing test unless otherwise instructed.  We will call to schedule your test 2-4 weeks out understanding that some insurance companies will need an authorization prior to the service being performed.   For more information and frequently asked questions, please visit our website : [email or web address]  For non-scheduling related questions, please contact the cardiac imaging nurse navigator should you have any questions/concerns: Cardiac Imaging Nurse Navigators Direct Office Dial: [phone]   For scheduling needs, including cancellations and rescheduling, please call [place], [phone].    Follow-Up: At [facility], you and your health needs are  our priority.  As part of our continuing mission to provide you with exceptional heart care, we have created designated Provider Care Teams.  These Care Teams include your primary Cardiologist (physician) and Advanced Practice Providers (APPs -   Physician Assistants and Nurse Practitioners) who all work together to provide you with the care you need, when you need it.  We recommend signing up for the patient portal called "MyChart".  Sign up information is provided on this After Visit Summary.  MyChart is used to connect with patients for Virtual Visits (Telemedicine).  Patients are able to view lab/test results, encounter notes, upcoming appointments, etc.  Non-urgent messages can be sent to your provider as well.   To learn more about what you can do with MyChart, go to [email or web address].    Your next appointment:    After Cardiac testing  Provider:   You may see Debbe Odea, MD or one of the following Advanced Practice Providers on your designated Care Team:   Nicolasa Ducking, NP Eula Listen, PA-C Cadence Fransico Michael, PA-C Charlsie Quest, NP

## 2022-12-29 LAB — LIPID PANEL
Chol/HDL Ratio: 4 ratio (ref 0.0–4.4)
Cholesterol, Total: 186 mg/dL (ref 100–199)
HDL: 46 mg/dL (ref >39)
LDL Chol Calc (NIH): 120 mg/dL — ABNORMAL HIGH (ref 0–99)
Triglycerides: 112 mg/dL (ref 0–149)
VLDL Cholesterol Cal: 20 mg/dL (ref 5–40)

## 2022-12-29 LAB — BASIC METABOLIC PANEL
BUN/Creatinine Ratio: 12 (ref 9–23)
BUN: 11 mg/dL (ref 6–20)
CO2: 22 mmol/L (ref 20–29)
Calcium: 8.7 mg/dL (ref 8.7–10.2)
Chloride: 104 mmol/L (ref 96–106)
Creatinine, Ser: 0.93 mg/dL (ref 0.57–1.00)
Glucose: 72 mg/dL (ref 70–99)
Potassium: 4.2 mmol/L (ref 3.5–5.2)
Sodium: 140 mmol/L (ref 134–144)
eGFR: 89 mL/min/{1.73_m2} (ref >59)

## 2022-12-29 LAB — LIPOPROTEIN A (LPA): Lipoprotein (a): 250.7 nmol/L — ABNORMAL HIGH (ref <75.0)
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
## 2023-01-18 ENCOUNTER — Ambulatory Visit: Payer: Medicaid Other | Attending: Cardiology

## 2023-01-18 DIAGNOSIS — R072 Precordial pain: Secondary | ICD-10-CM

## 2023-01-19 LAB — ECHOCARDIOGRAM COMPLETE
Area-P 1/2: 3.99 cm2
S' Lateral: 2.8 cm
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
## 2023-01-25 ENCOUNTER — Ambulatory Visit: Payer: Medicaid Other | Admitting: Student

## 2023-01-26 ENCOUNTER — Encounter: Payer: Self-pay | Admitting: Oncology

## 2023-01-26 ENCOUNTER — Telehealth (HOSPITAL_COMMUNITY): Payer: Self-pay | Admitting: *Deleted

## 2023-01-26 NOTE — Telephone Encounter (Signed)
Attempted to call patient regarding upcoming cardiac CT appointment. °Left message on voicemail with name and callback number ° °Edie Vallandingham RN Navigator Cardiac Imaging °[facility] °[phone] Office °[phone] Cell ° °

## 2023-01-27 ENCOUNTER — Ambulatory Visit
Admission: RE | Admit: 2023-01-27 | Discharge: 2023-01-27 | Disposition: A | Discharge status: Home / Self Care | Payer: Medicaid Other | Source: Ambulatory Visit | Attending: Cardiology | Admitting: Cardiology

## 2023-01-27 DIAGNOSIS — R072 Precordial pain: Secondary | ICD-10-CM | POA: Insufficient documentation

## 2023-01-27 MED ORDER — NITROGLYCERIN 0.4 MG SL SUBL
0.8000 mg | SUBLINGUAL_TABLET | Freq: Once | SUBLINGUAL | Status: AC
  Administered 2023-01-27: 0.8 mg via SUBLINGUAL

## 2023-01-27 MED ORDER — IOHEXOL 350 MG/ML SOLN
75.0000 mL | Freq: Once | INTRAVENOUS | Status: AC | PRN
  Administered 2023-01-27: 75 mL via INTRAVENOUS

## 2023-01-27 MED ORDER — SODIUM CHLORIDE 0.9 % IV SOLN: INTRAVENOUS | Status: DC

## 2023-01-27 NOTE — Progress Notes (Signed)
Patient tolerated CT well. Gave a bottle of water to patient to drink. Vital signs stable encourage to drink water throughout day.Reasons explained and verbalized understanding. Ambulated steady gait.

## 2023-01-29 IMAGING — DX DG FINGER MIDDLE 2+V*L*
3 series · 3 of 3 positions shown · non-contrast
Comparison: None Available.

CLINICAL DATA: Pain in proximal phalange, after box fell on finger

EXAM:
LEFT MIDDLE FINGER 2+V

[finger ap]
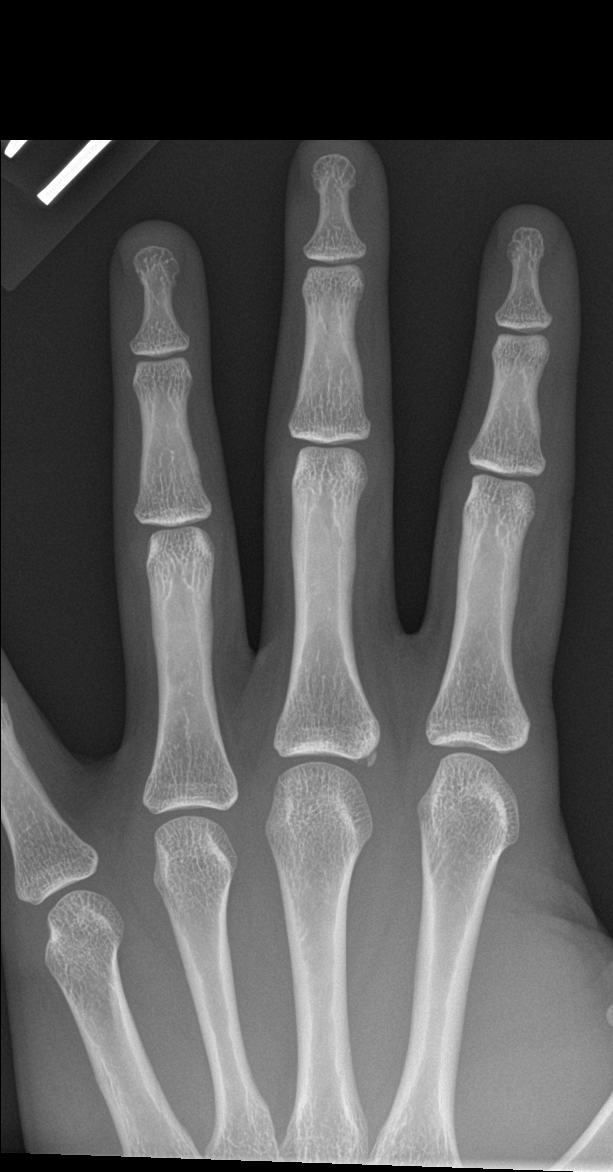

[finger obl]
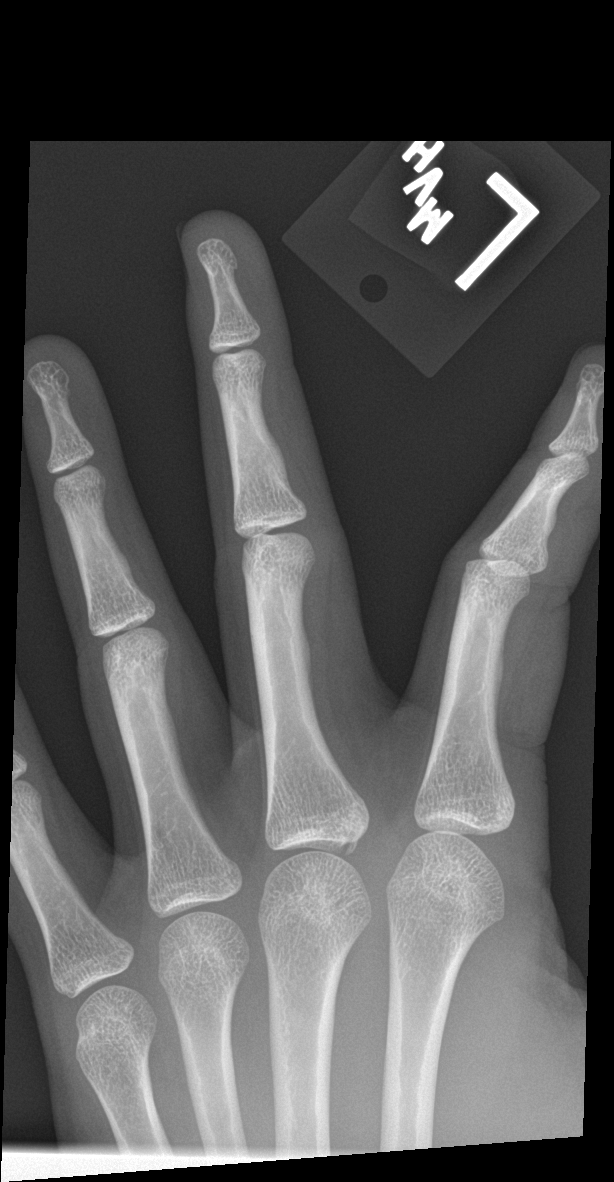

[finger lat]
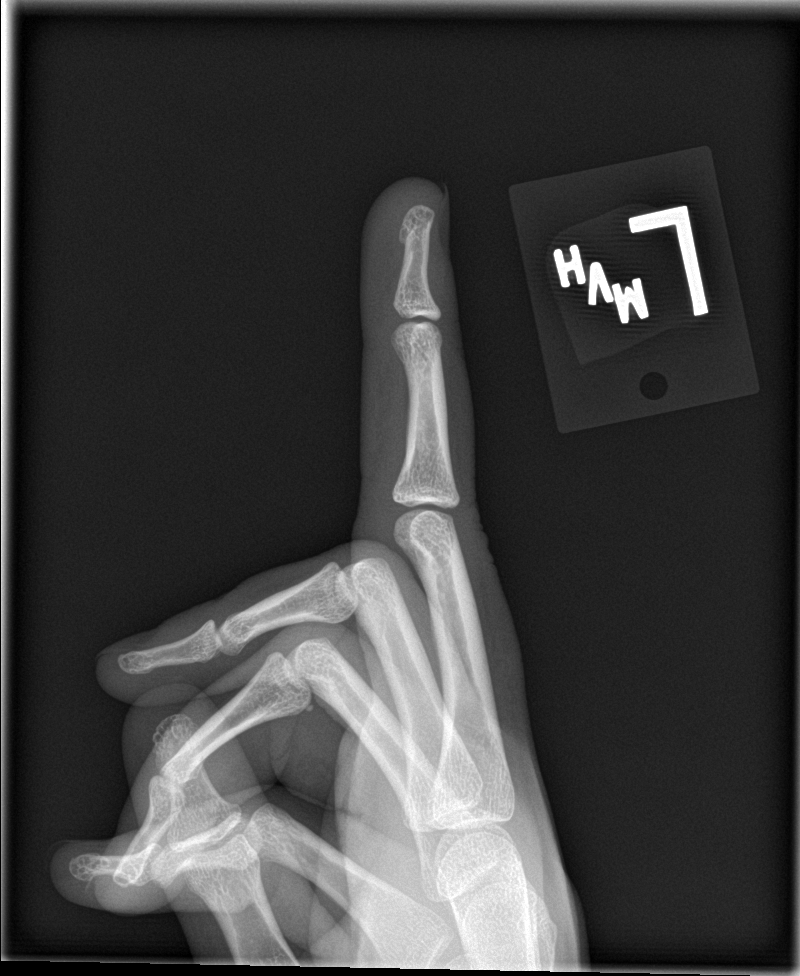

[3 of 3 positions shown; findings below may reference images not displayed]

FINDINGS: Acute minimally displaced intra-articular fracture of the base of
the first digit proximal phalanx. No dislocation. There is no
evidence of arthropathy or other focal bone abnormality. Associated
subcutaneus soft tissue edema.
IMPRESSION: Acute minimally displaced intra-articular fracture of the base of
the first digit proximal phalanx. No dislocation.

## 2023-01-31 ENCOUNTER — Ambulatory Visit: Payer: Medicaid Other | Attending: Student | Admitting: Student

## 2023-02-01 ENCOUNTER — Encounter: Payer: Self-pay | Admitting: Student

## 2023-05-08 ENCOUNTER — Other Ambulatory Visit: Payer: Self-pay | Admitting: Pharmacist
